# Patient Record
Sex: Female | Born: 1935 | Race: White | Hispanic: No | Smoking: Never smoker
Health system: Southern US, Community
[De-identification: ages and names within clinical notes are randomized; demographics above are authoritative.]

## PROBLEM LIST (undated history)

## (undated) DIAGNOSIS — I8500 Esophageal varices without bleeding: Secondary | ICD-10-CM

## (undated) DIAGNOSIS — I10 Essential (primary) hypertension: Secondary | ICD-10-CM

## (undated) DIAGNOSIS — H269 Unspecified cataract: Secondary | ICD-10-CM

## (undated) DIAGNOSIS — I4891 Unspecified atrial fibrillation: Secondary | ICD-10-CM

## (undated) DIAGNOSIS — R32 Unspecified urinary incontinence: Secondary | ICD-10-CM

## (undated) DIAGNOSIS — F0390 Unspecified dementia without behavioral disturbance: Secondary | ICD-10-CM

## (undated) DIAGNOSIS — F329 Major depressive disorder, single episode, unspecified: Secondary | ICD-10-CM

## (undated) DIAGNOSIS — K8020 Calculus of gallbladder without cholecystitis without obstruction: Secondary | ICD-10-CM

## (undated) DIAGNOSIS — K649 Unspecified hemorrhoids: Secondary | ICD-10-CM

## (undated) DIAGNOSIS — E785 Hyperlipidemia, unspecified: Secondary | ICD-10-CM

## (undated) DIAGNOSIS — I639 Cerebral infarction, unspecified: Secondary | ICD-10-CM

## (undated) DIAGNOSIS — N390 Urinary tract infection, site not specified: Secondary | ICD-10-CM

## (undated) DIAGNOSIS — M810 Age-related osteoporosis without current pathological fracture: Secondary | ICD-10-CM

## (undated) DIAGNOSIS — K219 Gastro-esophageal reflux disease without esophagitis: Secondary | ICD-10-CM

## (undated) DIAGNOSIS — K5900 Constipation, unspecified: Secondary | ICD-10-CM

## (undated) DIAGNOSIS — N189 Chronic kidney disease, unspecified: Secondary | ICD-10-CM

## (undated) DIAGNOSIS — K115 Sialolithiasis: Secondary | ICD-10-CM

## (undated) DIAGNOSIS — D649 Anemia, unspecified: Secondary | ICD-10-CM

## (undated) DIAGNOSIS — F32A Depression, unspecified: Secondary | ICD-10-CM

## (undated) DIAGNOSIS — M1990 Unspecified osteoarthritis, unspecified site: Secondary | ICD-10-CM

## (undated) DIAGNOSIS — S72009A Fracture of unspecified part of neck of unspecified femur, initial encounter for closed fracture: Secondary | ICD-10-CM

## (undated) HISTORY — PX: HIP FRACTURE SURGERY: SHX118

---

## 2008-10-14 ENCOUNTER — Ambulatory Visit (HOSPITAL_COMMUNITY): Admission: RE | Admit: 2008-10-14 | Discharge: 2008-10-14 | Payer: Self-pay | Admitting: Orthopedic Surgery

## 2008-10-18 ENCOUNTER — Inpatient Hospital Stay (HOSPITAL_COMMUNITY): Admission: EM | Admit: 2008-10-18 | Discharge: 2008-10-26 | Payer: Self-pay | Admitting: Emergency Medicine

## 2010-11-16 LAB — PROTIME-INR
INR: 3.4 — ABNORMAL HIGH (ref 0.00–1.49)
Prothrombin Time: 32.8 seconds — ABNORMAL HIGH (ref 11.6–15.2)
Prothrombin Time: 40.5 seconds — ABNORMAL HIGH (ref 11.6–15.2)
Prothrombin Time: 34.9 seconds — ABNORMAL HIGH (ref 11.6–15.2)

## 2010-11-16 LAB — POCT I-STAT, CHEM 8
Calcium, Ion: 1.04 mmol/L — ABNORMAL LOW (ref 1.12–1.32)
Creatinine, Ser: 0.9 mg/dL (ref 0.4–1.2)
Glucose, Bld: 136 mg/dL — ABNORMAL HIGH (ref 70–99)
Hemoglobin: 18.7 g/dL — ABNORMAL HIGH (ref 12.0–15.0)
TCO2: 25 mmol/L (ref 0–100)

## 2010-11-16 LAB — DIFFERENTIAL
Basophils Relative: 1 % (ref 0–1)
Basophils Relative: 1 % (ref 0–1)
Eosinophils Absolute: 0 10*3/uL (ref 0.0–0.7)
Eosinophils Absolute: 0 10*3/uL (ref 0.0–0.7)
Eosinophils Relative: 0 % (ref 0–5)
Eosinophils Relative: 5 % (ref 0–5)
Lymphocytes Relative: 27 % (ref 12–46)
Lymphs Abs: 1.4 10*3/uL (ref 0.7–4.0)
Lymphs Abs: 1.5 10*3/uL (ref 0.7–4.0)
Monocytes Absolute: 0.3 10*3/uL (ref 0.1–1.0)
Monocytes Absolute: 0.6 10*3/uL (ref 0.1–1.0)
Monocytes Relative: 10 % (ref 3–12)
Monocytes Relative: 8 % (ref 3–12)
Neutrophils Relative %: 70 % (ref 43–77)

## 2010-11-16 LAB — CBC
HCT: 44 % (ref 36.0–46.0)
Hemoglobin: 15.5 g/dL — ABNORMAL HIGH (ref 12.0–15.0)
Hemoglobin: 17 g/dL — ABNORMAL HIGH (ref 12.0–15.0)
MCHC: 33.8 g/dL (ref 30.0–36.0)
MCHC: 34.4 g/dL (ref 30.0–36.0)
MCV: 92.8 fL (ref 78.0–100.0)
MCV: 92.3 fL (ref 78.0–100.0)
Platelets: 305 10*3/uL (ref 150–400)
Platelets: 301 10*3/uL (ref 150–400)
Platelets: 334 10*3/uL (ref 150–400)
RBC: 4.94 MIL/uL (ref 3.87–5.11)
RDW: 15 % (ref 11.5–15.5)
RDW: 14.7 % (ref 11.5–15.5)
RDW: 14.8 % (ref 11.5–15.5)
WBC: 6.9 10*3/uL (ref 4.0–10.5)

## 2010-11-16 LAB — COMPREHENSIVE METABOLIC PANEL
ALT: 14 U/L (ref 0–35)
ALT: 14 U/L (ref 0–35)
AST: 30 U/L (ref 0–37)
AST: 32 U/L (ref 0–37)
Albumin: 2.8 g/dL — ABNORMAL LOW (ref 3.5–5.2)
Albumin: 3.1 g/dL — ABNORMAL LOW (ref 3.5–5.2)
Alkaline Phosphatase: 60 U/L (ref 39–117)
Alkaline Phosphatase: 75 U/L (ref 39–117)
CO2: 24 mEq/L (ref 19–32)
Calcium: 8.7 mg/dL (ref 8.4–10.5)
Calcium: 8.9 mg/dL (ref 8.4–10.5)
Chloride: 102 mEq/L (ref 96–112)
Creatinine, Ser: 1 mg/dL (ref 0.4–1.2)
GFR calc Af Amer: >60 mL/min (ref >60)
GFR calc Af Amer: >60 mL/min (ref >60)
GFR calc non Af Amer: >60 mL/min (ref >60)
GFR calc non Af Amer: 52 mL/min — ABNORMAL LOW (ref >60)
Glucose, Bld: 146 mg/dL — ABNORMAL HIGH (ref 70–99)
Potassium: 3.5 mEq/L (ref 3.5–5.1)
Potassium: 3.6 mEq/L (ref 3.5–5.1)
Sodium: 136 mEq/L (ref 135–145)
Sodium: 135 mEq/L (ref 135–145)
Sodium: 136 mEq/L (ref 135–145)
Total Protein: 6.5 g/dL (ref 6.0–8.3)
Total Protein: 7.2 g/dL (ref 6.0–8.3)

## 2010-11-16 LAB — TYPE AND SCREEN
ABO/RH(D): A POS
Antibody Screen: NEGATIVE

## 2010-11-16 LAB — BASIC METABOLIC PANEL
BUN: 5 mg/dL — ABNORMAL LOW (ref 6–23)
Chloride: 102 mEq/L (ref 96–112)
Creatinine, Ser: 0.76 mg/dL (ref 0.4–1.2)
Glucose, Bld: 144 mg/dL — ABNORMAL HIGH (ref 70–99)
Potassium: 3.7 mEq/L (ref 3.5–5.1)

## 2010-11-16 LAB — URINALYSIS, ROUTINE W REFLEX MICROSCOPIC
Glucose, UA: NEGATIVE mg/dL
Ketones, ur: >80 mg/dL — AB
Leukocytes, UA: NEGATIVE
Protein, ur: 30 mg/dL — AB

## 2010-11-16 LAB — URINE CULTURE: Culture: NO GROWTH

## 2010-11-16 LAB — TROPONIN I: Troponin I: 0.02 ng/mL (ref 0.00–0.06)

## 2010-11-16 LAB — GLUCOSE, CAPILLARY: Glucose-Capillary: 139 mg/dL — ABNORMAL HIGH (ref 70–99)

## 2010-11-16 LAB — CK TOTAL AND CKMB (NOT AT ARMC)
CK, MB: 2.4 ng/mL (ref 0.3–4.0)
Total CK: 53 U/L (ref 7–177)

## 2010-12-19 NOTE — Discharge Summary (Signed)
[name], [name]                  ACCOUNT NO.:  [id number]   MEDICAL RECORD NO.:  [id number]          PATIENT TYPE:  INP   LOCATION:  4530                         FACILITY:  [facility]   PHYSICIAN:  Peggye Pitt, M.D. DATE OF BIRTH:  May 28, 1917   DATE OF ADMISSION:  10/18/2008  DATE OF DISCHARGE:  10/26/2008                               DISCHARGE SUMMARY   DISCHARGE DIAGNOSES:  1. Abdominal pain, resolved.  2. Nausea and vomiting, resolved.  3. Atrial fibrillation off Coumadin.  4. Dementia.  5. Gastroesophageal reflux disease.  6. History of esophageal varices.  7. Possible thoracic aortic dissection.  8. Hypertension.  9. Failure to thrive.  10.Dehydration, resolved.  11.History of cerebrovascular accident.  12.History of hip fracture, no surgical intervention entertained.   DISCHARGE MEDICATIONS:  1. Lopressor 100 mg twice daily.  2. Mirtazapine 15 mg daily.  3. Prevacid 30 mg daily.  4. Calcium plus Vitamin D 600 mg 1 tablet twice daily.  5. Senna 1 tablet as needed for constipation.  6. Multivitamin 1 tablet daily.  7. Tylenol 650 mg p.o. every 6 h., as needed for pain.  8. Roxanol 5 mg every 4 h., as needed for pain.   DISPOSITION AND FOLLOW-UP:  [name] is discharged to a nursing  facility today in stable condition.  Please note that she is now a full  no code blue and a palliative care patient.  A palliative care  consultation needs to be obtained upon arrival at the nursing facility.  Please also note that she has a possible thoracic aortic dissection per  CT scan, so it is imperative that her blood pressure remain adequately  controlled.  On the day of this dictation, I have increased her  metoprolol 50 to 100 mg twice daily.  Her blood pressure will need to be  closely monitored and further antihypertensives added as needed.   CONSULTATIONS THIS HOSPITALIZATION:  Dr. Karilyn Cota with palliative care.   IMAGES AND PROCEDURES PERFORMED:  1. CT scan of the head  without contrast on October 18, 2008, that showed      atrophy and microvascular white matter disease with no acute      intracranial abnormality.  2. CT scan of the abdomen and pelvis on October 18, 2008, that showed      atherosclerotic changes involving the aorta without focal aneurysm.      Probable large ulcerative plaque versus small focal dissection      involving the distal descending thoracic aorta.  A hiatal hernia.      Cholelithiasis and mild gallbladder distention without definite CT      findings for acute cholecystitis.  No acute abdominal findings,      mass, lesions or adenopathy.  A chronic non-union hip fracture.   HISTORY AND PHYSICAL EXAM:  For full details, please refer to dictation  by Dr. Abram Sander on date October 18, 2008.  But in brief, [name] is a  pleasant 77 year old Caucasian woman who was scheduled to have repair of  her left hip fracture on Wednesday, October 20, 2008.  However, for  the  past several weeks she has been having chronic abdominal pain and has  been nauseated.  On the night prior to admission, she started vomiting  and retching to the point where the patient's family decided to bring  her into the hospital for evaluation and management.   HOSPITAL COURSE BY ACTIVE PROBLEM:  1. Abdominal pain, nausea and vomiting, completely resolved while in      the hospital.  This was likely related to possibly some acute viral      gastroenteritis, again this has completely resolved.  2. For her possible thoracic aortic dissection.  At this point, it is      imperative that she maintain good blood pressure control.      Metoprolol has been increased to 100 mg twice daily, and this will      need to be closely followed and medications adjusted as needed.  3. For her atrial fibrillation, it has been decided that she will be      maintained off Coumadin.  At present, good heart rate control on      beta-blocker.  4. Goals of care.  [name] has been made a full  DNR/DNI, no code      blue with full comfort care.  She will be transferred to a nursing      facility today where a palliative care consult is to be obtained.      No aggressive measures are to be entertained at this point.  5. The rest of the chronic medical issues were not a problem this      hospitalization.   Vital signs upon the day of this discharge; blood pressure 152/68, heart  rate 81, respirations 18.  O2 saturations 96% on room air with a  temperature of 99.1.      Peggye Pitt, M.D.  Electronically Signed     EH/MEDQ  D:  10/26/2008  T:  10/26/2008  Job:  [id number]   cc:   Wilson Singer, M.D.

## 2010-12-19 NOTE — H&P (Signed)
NAMECLETIS, MUMA                  ACCOUNT NO.:  [id number]   MEDICAL RECORD NO.:  [id number]          PATIENT TYPE:  INP   LOCATION:  5530                         FACILITY:  [facility]   PHYSICIAN:  Ruthy Dick, MD    DATE OF BIRTH:  [birth year]   DATE OF ADMISSION:  10/18/2008  DATE OF DISCHARGE:                              HISTORY & PHYSICAL   The patient seen and examined in the emergency room.   CHIEF COMPLAINTS:  Nausea, vomiting, abdominal pain, and failure to  thrive.   HISTORY OF PRESENT ILLNESS:  Ms. Chisolm is a cachectic 77 year old lady  with a past medical history significant for atrial fibrillation,  dementia, esophageal varices, gastroesophageal reflux disease, CVA, and  recurrent urinary tract infection.  She was actually scheduled to have  her left hip surgery on Wednesday, 03/17, but for the past several  weeks, the patient has been having issues with abdomen and has been  nauseated.  Last night, she started vomiting and as of this morning she  was retching to the point that the family decided to bring the patient  to the hospital.  I spoke to the daughter, Johnny Bridge, over the phone and  she said that the patient has stopped eating since about 2 weeks ago and  last week she had told the daughter that she wanted to die.  In any  case, the daughter said that the patient also had some element of  altered mentation today and that was also the reason they decided to  seek medical attention.  When I saw the patient, the grandson was in the  room with her and all the patient could really tell me was that she was  hurting in the abdominal area.  Her mentation was a little bit off, even  though she knew she was in the hospital.  I asked whether she was  hungry, she said she was not hungry, but she has not eaten for a while  now according to family members.  According to them, she has also lost a  lot of weight during this time.  Because the patient is not able to give  a good  history, our history taking is limited in this regards.  Of note  is the fact that the patient's vomitus was yellowish and no bloody  contents at all.  She did not have any diarrhea and according to the  daughter, the stools were formed and had no bloody contents either.   PAST MEDICAL HISTORY:  Atrial fibrillation, dementia, esophageal  varices, GERD, CVA in the past, and recurrent urinary tract infection  for which she is getting treatment, about 3 days remaining.  She has  also had hip fractures and left shoulder fracture for which she needed  surgery.  According to family members, they had to stop her Coumadin  about 3 days ago, so that she can go in for surgery, but this probably  would now hold because the patient could not get the stress test that  was being scheduled for today.   SOCIAL HISTORY:  The patient  lives at home now after being in the  nursing home for a while, but the daughter is considering asking social  service to help with in the nursing home again.  No alcohol, drugs, or  tobacco use.   FAMILY HISTORY:  Noncontributory.  The patient unable to answer this  question.   MEDICATIONS:  Presently, the patient takes,  1. Calcium 2 pills once daily.  2. Jantoven 2.5 mg.  3. Laxative senna as needed.  4. Mirtazapine 15 mg at bedtime.  5. Multivitamins 2 pills daily.  6. Prevacid 30 mg p.o. daily.  7. Tramadol.  8. Acetaminophen 50 mg as needed.  9. Tylenol as needed.   ALLERGIES:  CODEINE.   REVIEW OF SYSTEMS:  Unable to review systems because of the patient's  mentation.   PHYSICAL EXAMINATION:  GENERAL:  Seen and examined in the emergency  room.  She is partly alert, but slightly confused at this time, able to  answer some questions, but not all those.  VITAL SIGNS:  Temperature is 99.3, pulse is 112, blood pressure is  162/89, respirations 18, and she is saturating 97% on room air.  HEENT:  Normocephalic and atraumatic.  Pupils equal, round, and reactive   to light.  Extraocular muscles intact.  Nares patent.  NECK:  Supple.  No JVD.  No lymphadenopathy.  No thyromegaly.  CHEST:  Clear to auscultation bilaterally.  ABDOMEN:  Soft, but tenderness mostly in the epigastric and right upper  quadrant region.  No rebound.  No guarding.  EXTREMITIES:  No clubbing.  No cyanosis.  No edema.  CARDIOVASCULAR:  First and second heart sounds, only the tachycardia,  but regular rhythm.  CENTRAL NERVOUS SYSTEM:  No obvious focal deficits at this time, but as  already mentioned, confusion is slightly present.   LABORATORY DATA AND INVESTIGATIONS:  CT scan of the head read as being  negative for intracranial abnormalities.  Hemoglobin 18, hematocrit 55,  and WBC 4.2.  INR is 3.4 and PT 36.  Sodium 135, potassium 3.7, glucose  136, BUN 18, creatinine 0.9, and albumin 3.1.  Urinalysis shows no  evidence of urinary tract infection at this time.  Troponin is less than  0.05.  Lipase is 24.   ASSESSMENT:  1. Nausea and vomiting.  2. Abdominal pain.  3. Atrial fibrillation.  4. Dementia.  5. Esophageal varices.  6. Gastroesophageal reflux disease.  7. History of cerebrovascular accident.  8. Recurrent urinary tract infection, poorly treated.  9. History of hip fracture, was being planned for surgery.  10.Hypertension.  11.Failure to thrive.  12.Coagulopathy.  13.Dehydration.   PLAN OF CARE:  We will admit this patient to medicine floor and treat  her with IV fluids and also workup abdominal pain, nausea, and vomiting.  At this time, this may just be gastroenteritis, but we have to rule out  other  possibilities.  Since the patient has had stool, this may not be an  obstruction, but again we will be awaiting the CAT scan of the abdomen.  There is no evidence of pancreatitis either.  The patient has a failure  to thrive and we will address the code status with the family and the  patient when they come in.      Ruthy Dick, MD  Electronically  Signed     GU/MEDQ  D:  10/18/2008  T:  10/19/2008  Job:  [id number]

## 2010-12-19 NOTE — Consult Note (Signed)
[name], [name]                  ACCOUNT NO.:  [id number]   MEDICAL RECORD NO.:  [id number]          PATIENT TYPE:  INP   LOCATION:  4530                         FACILITY:  [facility]   PHYSICIAN:  Wilson Singer, M.D.DATE OF BIRTH:  June 05, 1917   DATE OF CONSULTATION:  10/19/2008  DATE OF DISCHARGE:                                 CONSULTATION   Consult is for goals of care.   REQUESTING PHYSICIAN:  Ruthy Dick, MD   This nurse practitioner, Gardiner Rhyme, reviewed medical records, received  report from team, assessed the patient, and then met at the patient's  bedside with her daughter, [name], phone number [phone] to discuss  diagnosis, prognosis, goals of care, disposition, and options.   THE PATIENT'S/FAMILY GOALS:  1. DNR/DNI.  2. No artificial feeding now or in the future.  3. Transfer to the palliative care unit, comfort being the main focus      of care.      a.     Comfort feeds and fluids of choice with known risk of       aspiration.      b.     Symptom management.      c.     Continue IV fluids.      d.     No surgical intervention for hip repair.   A detailed discussion was held today regarding advance directives,  concepts specific to code status, artificial feeding, and hydration,  continued use of IV antibiotics, and rehospitalization.  The difference  between aggressive medical intervention path and a palliative comfort  care path was detailed.  The patient's daughter was grateful for the  opportunity to discuss and process this difficult decision making  situation.  The patient verbalized agreement with above goals.  Palliative care will continue to support holistically.   CHIEF COMPLAINT:  Weakness.   HISTORY OF PRESENT ILLNESS:  This patient is a frail cachectic 77-year-  old white female with a past medical history significant for a AFib,  dementia, esophageal varices, GERD, CVA, recurrent urinary tract  infection.  She was admitted on October 18, 2008, after several days of  nausea and vomiting and several weeks of significant decline to include  weakness, fatigue, decrease in mental alertness, increase in sleep  patterns, and weight loss.  She has a nonunion hip fracture that was  actually scheduled to be repaired on Wednesday, October 20, 2008, but due  to the patient's decline over the last many weeks, that discussion has  been put off for a later time.  The Palliative Care consult was held  today in light of the patient's advanced age and multiple comorbidities.  Palliative Care will continue to support holistically.   PAST MEDICAL HISTORY:  1. Atrial fib.  2. Dementia.  3. Esophageal varices.  4. GERD.  5. CVA.  6. Recurrent UTIs.  7. Decubitus ulcers.  8. Dysphagia.  9. Left hip fracture.  10.Left shoulder fracture.   FAMILY HISTORY:  Reviewed, noncontributory at this time.   SOCIAL HISTORY:  The patient lives at home with  her daughter, [name] at this point in time.  She had spent time in several skilled  nursing facilities for rehab over the past year post hip fracture in  June 2009.  The patient has 2 daughters and 2 sons, 1 son is  incarcerated in a facility in [place]. Daughter that is main caregiver  is [name], who is present at today's meeting.   Alcohol, tobacco, and illicit drug use is denied.   ALLERGIES:  CODEINE.   MEDICATIONS:  Reviewed, please see MAR for active list of meds.   REVIEW OF SYSTEMS:  As per mentioned in HPI and significant weight loss  of 60 pounds over the last year, decreased intake, increased difficulty  swallowing, decreased physical function, increased sleeping pattern;  otherwise, systems negative at this time.   PHYSICAL EXAMINATION:  VITAL SIGNS:  Blood pressure 186/88, temperature  99, pulse 89, respirations 20.  GENERAL:  This is a chronically ill-appearing frail elderly white  female.  HEENT:  Head is atraumatic.  Positive temporal muscle wasting.  Eyes,   pupils are equal and reactive to light.  Mouth, mucous membranes are  dry.  No exudate noted.  NECK:  Supple.  No JVD.  No lymphadenopathy.  CHEST:  Decreased in the bases but clear to auscultation.  ABDOMEN:  Soft, nontender, and positive bowel sounds.  HEART:  Regular rate and rhythm.  No murmur noted.  EXTREMITIES:  Without edema or erythema.  SKIN:  Warm and dry.  No rash or petechiae.  NEUROLOGIC:  The patient is alert and oriented x3.   LABORATORY DATA:  Reviewed from October 19, 2008, sodium 136, potassium  3.5, chloride 102, CO2 24, glucose 146, BUN 8, creatinine 0.72.  Total  protein 5.9, albumin blood 2.7, calcium 8.7.  WBCs 6.9, RBCs 4.94,  hemoglobin 15.5, hematocrit 45.8, platelet count 338.   Total time spent on the unit was 90 minutes, time in 2:30 and time out 4  o'clock.  Counseling and coordination of care composed 50% or greater  portion of this interaction.  Diagnosis and prognosis was clarified.  The concept of hospice and palliative care was reviewed.  Team approach  to our service was elicited.  Social worker with palliative  care team notified with concerns related to incarcerated son and  visitation.  A hard choices booklet was left with the family to review.  They are encouraged to call with questions or concerns.  Palliative Care  will continue to support holistically.      Herbert Pun, NP      Wilson Singer, M.D.  Electronically Signed    MCL/MEDQ  D:  10/19/2008  T:  10/20/2008  Job:  [id number]   cc:   Ruthy Dick, MD  [facility]

## 2014-02-18 ENCOUNTER — Emergency Department (HOSPITAL_COMMUNITY): Payer: PRIVATE HEALTH INSURANCE

## 2014-02-18 ENCOUNTER — Encounter (HOSPITAL_COMMUNITY): Payer: Self-pay | Admitting: Emergency Medicine

## 2014-02-18 ENCOUNTER — Inpatient Hospital Stay (HOSPITAL_COMMUNITY)
Admission: EM | Admit: 2014-02-18 | Discharge: 2014-02-20 | DRG: 534 | Disposition: A | Discharge status: Skilled Nursing Facility | Payer: PRIVATE HEALTH INSURANCE | Attending: Internal Medicine | Admitting: Internal Medicine

## 2014-02-18 ENCOUNTER — Inpatient Hospital Stay (HOSPITAL_COMMUNITY): Payer: PRIVATE HEALTH INSURANCE

## 2014-02-18 DIAGNOSIS — K219 Gastro-esophageal reflux disease without esophagitis: Secondary | ICD-10-CM | POA: Diagnosis present

## 2014-02-18 DIAGNOSIS — IMO0001 Reserved for inherently not codable concepts without codable children: Secondary | ICD-10-CM | POA: Diagnosis present

## 2014-02-18 DIAGNOSIS — M810 Age-related osteoporosis without current pathological fracture: Secondary | ICD-10-CM | POA: Diagnosis present

## 2014-02-18 DIAGNOSIS — S7291XA Unspecified fracture of right femur, initial encounter for closed fracture: Secondary | ICD-10-CM

## 2014-02-18 DIAGNOSIS — D72829 Elevated white blood cell count, unspecified: Secondary | ICD-10-CM | POA: Diagnosis present

## 2014-02-18 DIAGNOSIS — N189 Chronic kidney disease, unspecified: Secondary | ICD-10-CM | POA: Diagnosis present

## 2014-02-18 DIAGNOSIS — F329 Major depressive disorder, single episode, unspecified: Secondary | ICD-10-CM | POA: Diagnosis present

## 2014-02-18 DIAGNOSIS — Z79899 Other long term (current) drug therapy: Secondary | ICD-10-CM | POA: Diagnosis not present

## 2014-02-18 DIAGNOSIS — Z7401 Bed confinement status: Secondary | ICD-10-CM | POA: Diagnosis not present

## 2014-02-18 DIAGNOSIS — Z8673 Personal history of transient ischemic attack (TIA), and cerebral infarction without residual deficits: Secondary | ICD-10-CM

## 2014-02-18 DIAGNOSIS — F3289 Other specified depressive episodes: Secondary | ICD-10-CM | POA: Diagnosis present

## 2014-02-18 DIAGNOSIS — I4891 Unspecified atrial fibrillation: Secondary | ICD-10-CM | POA: Diagnosis present

## 2014-02-18 DIAGNOSIS — Z96649 Presence of unspecified artificial hip joint: Secondary | ICD-10-CM | POA: Diagnosis not present

## 2014-02-18 DIAGNOSIS — F411 Generalized anxiety disorder: Secondary | ICD-10-CM | POA: Diagnosis present

## 2014-02-18 DIAGNOSIS — Z7982 Long term (current) use of aspirin: Secondary | ICD-10-CM

## 2014-02-18 DIAGNOSIS — M1990 Unspecified osteoarthritis, unspecified site: Secondary | ICD-10-CM | POA: Diagnosis present

## 2014-02-18 DIAGNOSIS — S72001A Fracture of unspecified part of neck of right femur, initial encounter for closed fracture: Secondary | ICD-10-CM

## 2014-02-18 DIAGNOSIS — T753XXA Motion sickness, initial encounter: Secondary | ICD-10-CM | POA: Diagnosis present

## 2014-02-18 DIAGNOSIS — E785 Hyperlipidemia, unspecified: Secondary | ICD-10-CM | POA: Diagnosis present

## 2014-02-18 DIAGNOSIS — S72009P Fracture of unspecified part of neck of unspecified femur, subsequent encounter for closed fracture with malunion: Secondary | ICD-10-CM

## 2014-02-18 DIAGNOSIS — S72309A Unspecified fracture of shaft of unspecified femur, initial encounter for closed fracture: Secondary | ICD-10-CM | POA: Diagnosis present

## 2014-02-18 DIAGNOSIS — I129 Hypertensive chronic kidney disease with stage 1 through stage 4 chronic kidney disease, or unspecified chronic kidney disease: Secondary | ICD-10-CM | POA: Diagnosis present

## 2014-02-18 DIAGNOSIS — Z66 Do not resuscitate: Secondary | ICD-10-CM | POA: Diagnosis present

## 2014-02-18 DIAGNOSIS — M25559 Pain in unspecified hip: Secondary | ICD-10-CM | POA: Diagnosis not present

## 2014-02-18 DIAGNOSIS — R030 Elevated blood-pressure reading, without diagnosis of hypertension: Secondary | ICD-10-CM

## 2014-02-18 DIAGNOSIS — F03918 Unspecified dementia, unspecified severity, with other behavioral disturbance: Secondary | ICD-10-CM | POA: Diagnosis present

## 2014-02-18 DIAGNOSIS — I480 Paroxysmal atrial fibrillation: Secondary | ICD-10-CM

## 2014-02-18 DIAGNOSIS — F0391 Unspecified dementia with behavioral disturbance: Secondary | ICD-10-CM | POA: Diagnosis present

## 2014-02-18 DIAGNOSIS — F32A Depression, unspecified: Secondary | ICD-10-CM

## 2014-02-18 DIAGNOSIS — S72009A Fracture of unspecified part of neck of unspecified femur, initial encounter for closed fracture: Secondary | ICD-10-CM

## 2014-02-18 DIAGNOSIS — D649 Anemia, unspecified: Secondary | ICD-10-CM | POA: Diagnosis present

## 2014-02-18 HISTORY — DX: Unspecified atrial fibrillation: I48.91

## 2014-02-18 HISTORY — DX: Essential (primary) hypertension: I10

## 2014-02-18 HISTORY — DX: Depression, unspecified: F32.A

## 2014-02-18 HISTORY — DX: Cerebral infarction, unspecified: I63.9

## 2014-02-18 HISTORY — DX: Hyperlipidemia, unspecified: E78.5

## 2014-02-18 HISTORY — DX: Urinary tract infection, site not specified: N39.0

## 2014-02-18 HISTORY — DX: Esophageal varices without bleeding: I85.00

## 2014-02-18 HISTORY — DX: Unspecified osteoarthritis, unspecified site: M19.90

## 2014-02-18 HISTORY — DX: Unspecified cataract: H26.9

## 2014-02-18 HISTORY — DX: Major depressive disorder, single episode, unspecified: F32.9

## 2014-02-18 HISTORY — DX: Calculus of gallbladder without cholecystitis without obstruction: K80.20

## 2014-02-18 HISTORY — DX: Constipation, unspecified: K59.00

## 2014-02-18 HISTORY — DX: Unspecified dementia, unspecified severity, without behavioral disturbance, psychotic disturbance, mood disturbance, and anxiety: F03.90

## 2014-02-18 HISTORY — DX: Sialolithiasis: K11.5

## 2014-02-18 HISTORY — DX: Age-related osteoporosis without current pathological fracture: M81.0

## 2014-02-18 HISTORY — DX: Chronic kidney disease, unspecified: N18.9

## 2014-02-18 HISTORY — DX: Gastro-esophageal reflux disease without esophagitis: K21.9

## 2014-02-18 HISTORY — DX: Anemia, unspecified: D64.9

## 2014-02-18 HISTORY — DX: Unspecified hemorrhoids: K64.9

## 2014-02-18 HISTORY — DX: Unspecified urinary incontinence: R32

## 2014-02-18 HISTORY — DX: Fracture of unspecified part of neck of unspecified femur, initial encounter for closed fracture: S72.009A

## 2014-02-18 LAB — ABO/RH: ABO/RH(D): A POS

## 2014-02-18 LAB — BASIC METABOLIC PANEL
Anion gap: 14 (ref 5–15)
BUN: 21 mg/dL (ref 6–23)
CO2: 21 mEq/L (ref 19–32)
CREATININE: 0.87 mg/dL (ref 0.50–1.10)
Calcium: 9.3 mg/dL (ref 8.4–10.5)
Chloride: 102 mEq/L (ref 96–112)
GFR, EST AFRICAN AMERICAN: 64 mL/min — AB (ref >90)
GFR, EST NON AFRICAN AMERICAN: 55 mL/min — AB (ref >90)
GLUCOSE: 173 mg/dL — AB (ref 70–99)
Potassium: 4.2 mEq/L (ref 3.7–5.3)
Sodium: 137 mEq/L (ref 137–147)

## 2014-02-18 LAB — CBC
HEMATOCRIT: 36.3 % (ref 36.0–46.0)
HEMOGLOBIN: 12.1 g/dL (ref 12.0–15.0)
MCH: 31.9 pg (ref 26.0–34.0)
MCHC: 33.3 g/dL (ref 30.0–36.0)
MCV: 95.8 fL (ref 78.0–100.0)
Platelets: 328 10*3/uL (ref 150–400)
RBC: 3.79 MIL/uL — ABNORMAL LOW (ref 3.87–5.11)
RDW: 13.4 % (ref 11.5–15.5)
WBC: 12.2 10*3/uL — ABNORMAL HIGH (ref 4.0–10.5)

## 2014-02-18 LAB — URINALYSIS, ROUTINE W REFLEX MICROSCOPIC
BILIRUBIN URINE: NEGATIVE
GLUCOSE, UA: NEGATIVE mg/dL
Hgb urine dipstick: NEGATIVE
KETONES UR: NEGATIVE mg/dL
LEUKOCYTES UA: NEGATIVE
NITRITE: NEGATIVE
PH: 6 (ref 5.0–8.0)
Protein, ur: 30 mg/dL — AB
SPECIFIC GRAVITY, URINE: 1.016 (ref 1.005–1.030)
Urobilinogen, UA: 0.2 mg/dL (ref 0.0–1.0)

## 2014-02-18 LAB — URINE MICROSCOPIC-ADD ON

## 2014-02-18 LAB — PROTIME-INR
INR: 0.94 (ref 0.00–1.49)
Prothrombin Time: 12.6 seconds (ref 11.6–15.2)

## 2014-02-18 LAB — TYPE AND SCREEN
ABO/RH(D): A POS
Antibody Screen: NEGATIVE

## 2014-02-18 MED ORDER — PHENOL 1.4 % MT LIQD: 1.0000 | OROMUCOSAL | Status: DC | PRN

## 2014-02-18 MED ORDER — ADULT MULTIVITAMIN LIQUID CH
5.0000 mL | Freq: Every day | ORAL | Status: DC
  Administered 2014-02-20: 5 mL via ORAL
  Filled 2014-02-18 (×3): qty 5

## 2014-02-18 MED ORDER — MULTIVITAMINS PO CHEW: 1.0000 | CHEWABLE_TABLET | Freq: Every day | ORAL | Status: DC

## 2014-02-18 MED ORDER — HYDROCODONE-ACETAMINOPHEN 5-325 MG PO TABS: 1.0000 | ORAL_TABLET | Freq: Four times a day (QID) | ORAL | Status: DC | PRN

## 2014-02-18 MED ORDER — PANTOPRAZOLE SODIUM 40 MG PO TBEC
40.0000 mg | DELAYED_RELEASE_TABLET | Freq: Every day | ORAL | Status: DC
  Administered 2014-02-19 – 2014-02-20 (×2): 40 mg via ORAL
  Filled 2014-02-18 (×2): qty 1

## 2014-02-18 MED ORDER — DEXTROSE 5 % IV SOLN
1.0000 g | Freq: Two times a day (BID) | INTRAVENOUS | Status: DC
  Administered 2014-02-19 (×2): 1 g via INTRAVENOUS
  Filled 2014-02-18 (×2): qty 1

## 2014-02-18 MED ORDER — LORAZEPAM 0.5 MG PO TABS: 0.2500 mg | ORAL_TABLET | Freq: Four times a day (QID) | ORAL | Status: DC | PRN

## 2014-02-18 MED ORDER — DOCUSATE SODIUM 100 MG PO CAPS: 100.0000 mg | ORAL_CAPSULE | Freq: Two times a day (BID) | ORAL | Status: DC

## 2014-02-18 MED ORDER — TRAMADOL-ACETAMINOPHEN 37.5-325 MG PO TABS
1.0000 | ORAL_TABLET | Freq: Two times a day (BID) | ORAL | Status: DC
  Administered 2014-02-19 – 2014-02-20 (×3): 1 via ORAL
  Filled 2014-02-18 (×4): qty 1

## 2014-02-18 MED ORDER — CALCIUM CARBONATE 600 MG PO TABS
1.0000 | ORAL_TABLET | Freq: Every day | ORAL | Status: DC
  Filled 2014-02-18: qty 1

## 2014-02-18 MED ORDER — VANCOMYCIN HCL IN DEXTROSE 750-5 MG/150ML-% IV SOLN
750.0000 mg | INTRAVENOUS | Status: DC
  Administered 2014-02-19: 750 mg via INTRAVENOUS
  Filled 2014-02-18: qty 150

## 2014-02-18 MED ORDER — POLYETHYLENE GLYCOL 3350 17 G PO PACK
17.0000 g | PACK | Freq: Every day | ORAL | Status: DC
  Administered 2014-02-19 – 2014-02-20 (×2): 17 g via ORAL

## 2014-02-18 MED ORDER — FENTANYL 12 MCG/HR TD PT72
12.0000 ug | MEDICATED_PATCH | TRANSDERMAL | Status: DC
  Administered 2014-02-19: 12.5 ug via TRANSDERMAL
  Filled 2014-02-18: qty 1

## 2014-02-18 MED ORDER — BISACODYL 10 MG RE SUPP: 10.0000 mg | Freq: Every day | RECTAL | Status: DC | PRN

## 2014-02-18 MED ORDER — FENTANYL CITRATE 0.05 MG/ML IJ SOLN
50.0000 ug | Freq: Once | INTRAMUSCULAR | Status: AC
  Administered 2014-02-18: 50 ug via INTRAVENOUS
  Filled 2014-02-18: qty 2

## 2014-02-18 MED ORDER — HYDRALAZINE HCL 20 MG/ML IJ SOLN
10.0000 mg | INTRAMUSCULAR | Status: DC | PRN
  Filled 2014-02-18: qty 0.5

## 2014-02-18 MED ORDER — ANTIPYRINE-BENZOCAINE 5.4-1.4 % OT SOLN: 4.0000 [drp] | Freq: Four times a day (QID) | OTIC | Status: DC | PRN

## 2014-02-18 MED ORDER — ONDANSETRON HCL 4 MG/2ML IJ SOLN
4.0000 mg | Freq: Once | INTRAMUSCULAR | Status: AC
  Administered 2014-02-18: 4 mg via INTRAVENOUS
  Filled 2014-02-18: qty 2

## 2014-02-18 MED ORDER — BIOTENE DRY MOUTH MT LIQD: 15.0000 mL | OROMUCOSAL | Status: DC | PRN

## 2014-02-18 MED ORDER — CALCIUM CARBONATE 1250 (500 CA) MG PO TABS
1.0000 | ORAL_TABLET | Freq: Every day | ORAL | Status: DC
  Administered 2014-02-19 – 2014-02-20 (×2): 500 mg via ORAL
  Filled 2014-02-18 (×3): qty 1

## 2014-02-18 MED ORDER — ESCITALOPRAM OXALATE 10 MG PO TABS
10.0000 mg | ORAL_TABLET | Freq: Every day | ORAL | Status: DC
  Administered 2014-02-19 – 2014-02-20 (×2): 10 mg via ORAL
  Filled 2014-02-18 (×3): qty 1

## 2014-02-18 MED ORDER — MORPHINE SULFATE 2 MG/ML IJ SOLN
0.5000 mg | INTRAMUSCULAR | Status: DC | PRN
  Administered 2014-02-19 (×2): 0.5 mg via INTRAVENOUS
  Filled 2014-02-18 (×2): qty 1

## 2014-02-18 NOTE — Consult Note (Signed)
ORTHOPAEDIC CONSULTATION  REQUESTING PHYSICIAN: Eduard Clos, MD  Chief Complaint: right femur fx  HPI: [name] is a 80 y.o. female who complains of right femur fx after being moved by her caregivers and felt a snap in the thigh.  Had immediate pain.  Patient has not walked since 2010 ever since her original hip surgery was done for fx.  Has not transferred out of bed since march of this year.  Patient is moderately demented.    Past Medical History  Diagnosis Date  . Atrial fibrillation   . Esophageal reflux   . Hypertension   . Constipation   . Cholelithiasis   . Hemorrhoids   . Esophageal varices   . Dementia   . Depression   . Anemia   . Osteoarthritis   . Hyperlipidemia   . Sialolithiasis   . GERD (gastroesophageal reflux disease)   . Urinary incontinence   . Recurrent UTI (urinary tract infection)   . Chronic renal insufficiency   . CVA (cerebral infarction)   . Cataract   . Osteoporosis   . Hip fracture    Past Surgical History  Procedure Laterality Date  . Hip fracture surgery Right    History   Social History  . Marital Status: Widowed    Spouse Name: N/A    Number of Children: N/A  . Years of Education: N/A   Social History Main Topics  . Smoking status: Never Smoker   . Smokeless tobacco: None  . Alcohol Use: No  . Drug Use: No  . Sexual Activity: None   Other Topics Concern  . None   Social History Narrative  . None   History reviewed. No pertinent family history. Allergies  Allergen Reactions  . Codeine     Per MAR  . Septra [Sulfamethoxazole-Trimethoprim]     Per MAR  . Wellbutrin [Bupropion]     Per MAR   Prior to Admission medications   Medication Sig Start Date End Date Taking? Authorizing Provider  acetaminophen (TYLENOL) 325 MG tablet Take 650 mg by mouth every 4 (four) hours as needed for mild pain or fever.   Yes Historical Provider, MD  antipyrine-benzocaine Lyla Son) otic solution Place 4 drops into the  right ear 4 (four) times daily as needed for ear pain.   Yes Historical Provider, MD  antiseptic oral rinse (BIOTENE) LIQD 15 mLs by Mouth Rinse route every 4 (four) hours as needed for dry mouth.   Yes Historical Provider, MD  aspirin 81 MG chewable tablet Chew 81 mg by mouth daily with breakfast.   Yes Historical Provider, MD  benzocaine-menthol (CHLORASEPTIC) 6-10 MG lozenge Take 1 lozenge by mouth every 4 (four) hours as needed for sore throat.   Yes Historical Provider, MD  bisacodyl (DULCOLAX) 10 MG suppository Place 10 mg rectally daily as needed (for constipation).   Yes Historical Provider, MD  Calcium Carbonate 500 MG CHEW Chew 1 tablet by mouth daily with breakfast.   Yes Historical Provider, MD  docusate sodium (COLACE) 100 MG capsule Take 100 mg by mouth 2 (two) times daily.   Yes Historical Provider, MD  escitalopram (LEXAPRO) 10 MG tablet Take 10 mg by mouth daily with breakfast.   Yes Historical Provider, MD  fentaNYL (DURAGESIC) 12 MCG/HR Place 12 mcg onto the skin every 3 (three) days.   Yes Historical Provider, MD  LORazepam (ATIVAN) 0.5 MG tablet Take 0.25 mg by mouth every 6 (six) hours as needed for anxiety.  Yes Historical Provider, MD  morphine (ROXANOL) 20 MG/ML concentrated solution Take by mouth once. Dose is 0.125   Yes Historical Provider, MD  Multiple Vitamins-Minerals (MULTIVITAMINS) CHEW Chew 1 tablet by mouth daily with breakfast.   Yes Historical Provider, MD  omeprazole (PRILOSEC) 40 MG capsule Take 40 mg by mouth daily with breakfast.   Yes Historical Provider, MD  OVER THE COUNTER MEDICATION Take 1 Bottle by mouth 2 (two) times daily. Magic Cup with lunch and supper   Yes Historical Provider, MD  phenol (CHLORASEPTIC) 1.4 % LIQD Use as directed 1 spray in the mouth or throat every 2 (two) hours as needed for throat irritation / pain.   Yes Historical Provider, MD  polyethylene glycol (MIRALAX / GLYCOLAX) packet Take 17 g by mouth daily with breakfast.   Yes  Historical Provider, MD  traMADol-acetaminophen (ULTRACET) 37.5-325 MG per tablet Take 1 tablet by mouth 2 (two) times daily. Takes at 0800 and 1800   Yes Historical Provider, MD  Vitamin D, Ergocalciferol, (DRISDOL) 50000 UNITS CAPS capsule Take 50,000 Units by mouth every 30 (thirty) days. Takes on the 15th of every month   Yes Historical Provider, MD   Dg Pelvis 1-2 Views  02/18/2014   CLINICAL DATA:  Trauma  EXAM: PELVIS - 1-2 VIEW  COMPARISON:  Prior CT from 10/18/2008  FINDINGS: Sequelae of prior ORIF seen at the right hip. There is an acute subcapital fracture through the right femoral neck with superior subluxation of the proximal femoral shaft. The fracture traverses the fixation hardware. The hardware itself remains intact. The right femoral head remains aligned within the acetabulum. The femoral head itself is partially collapsed.  An additional acute oblique fracture through the proximal right femoral shaft is present without significant displacement.  Severe osteopenia present. Degenerative changes present within the lower lumbar spine. No other fracture seen within the pelvis.  IMPRESSION: 1. Acute fracture through the right femoral neck with superior subluxation. This fracture extends through fixation hardware from prior ORIF at the right hip. The hardware itself but remains intact. 2. Additional acute nondisplaced oblique fracture through the proximal shaft of the right femur. 3. Severe osteopenia.   Electronically Signed   By: Rise Mu M.D.   On: 02/18/2014 17:25   Dg Femur Right  02/18/2014   CLINICAL DATA:  HIP INJURY LEG INJURY  EXAM: RIGHT FEMUR - 2 VIEW  COMPARISON:  Prior study from 03/27/2008  FINDINGS: Sequelae of prior ORIF seen at the right hip. There is an acute fracture through the right femoral neck with superior subluxation of the right femoral shaft. Additional acute oblique fracture through the proximal shaft of the right femur is present. The right femoral head  remains within the right acetabulum.  Diffuse osteopenia noted.  No soft tissue abnormality.  IMPRESSION: 1. Acute oblique fracture through the proximal right femoral shaft. 2. Additional acute fracture through the right femoral neck with superior subluxation. 3. Sequelae of prior ORIF at the right hip. The proximal fracture through the femoral neck traverses the fixation hardware. The hardware itself is intact. 4. Diffuse osteopenia.   Electronically Signed   By: Rise Mu M.D.   On: 02/18/2014 17:20   Dg Chest Port 1 View  02/18/2014   CLINICAL DATA:  Shortness of breath and weakness.  EXAM: PORTABLE CHEST - 1 VIEW  COMPARISON:  03/24/2008  FINDINGS: Lungs are somewhat hypoinflated without effusion. There is minimal hazy density in the left retrocardiac region. There is mild cardiomegaly. There  is calcified plaque over the thoracic aorta. Left shoulder arthroplasty unchanged. There are moderate degenerative changes of the spine.  IMPRESSION: Minimal hazy density over the left retrocardiac region as cannot exclude atelectasis/effusion versus infection. Consider PA and lateral chest radiograph for better evaluation of the left base.  Mild cardiomegaly.   Electronically Signed   By: Elberta Fortisaniel  Boyle M.D.   On: 02/18/2014 21:02   Dg Knee Complete 4 Views Right  02/18/2014   CLINICAL DATA:  Trauma  EXAM: RIGHT KNEE - COMPLETE 4+ VIEW  COMPARISON:  Prior radiograph from 03/27/2008.  FINDINGS: No acute fracture or dislocation. No joint effusion. The knee is in anatomic alignment.  Severe osteopenia is present.  IMPRESSION: 1. No acute fracture or dislocation. 2. Severe osteopenia.   Electronically Signed   By: Rise MuBenjamin  McClintock M.D.   On: 02/18/2014 17:27    Positive ROS: All other systems have been reviewed and were otherwise negative with the exception of those mentioned in the HPI and as above.  Physical Exam: General: NAD Cardiovascular: No pedal edema Respiratory: No cyanosis, no use of  accessory musculature GI: No organomegaly, abdomen is soft and non-tender Skin: No lesions in the area of chief complaint Neurologic: Sensation intact distally Psychiatric: Pleasantly demented.  Not oriented to time.  Lymphatic: No axillary or cervical lymphadenopathy  MUSCULOSKELETAL:  - hip and knee flexion contractures - painful movement of RLE - wiggles toes on command - foot wwp  Assessment: Right periprosthetic femur fx  Plan: - patient's pain was well controlled with fentanyl per RN - after discussion with daughter and patient's baseline nonambulatory status, would question the benefit of surgical treatment - however, patient and family have requested Dr. August Saucerean, will ultimately leave decision up to Dr. August Saucerean and patient and family - NPO after midnight for now  Thank you for the consult and the opportunity to see Ms. Izell CarolinaMacon  N. Glee ArvinMichael Torrey Ballinas, MD [facility] [phone] 9:44 PM

## 2014-02-18 NOTE — ED Notes (Addendum)
Per EMS, [facility], c/o R leg/hip injury.  EMS reports that facility was attempting an In and Out catheterization when injury occurred.  Hx of hip fracture.    Family reports previous R hip fracture that never healed correctly.  Sts hips are flexed at all times.

## 2014-02-18 NOTE — ED Notes (Signed)
Bed: [id number] Expected date:  Expected time:  Means of arrival:  Comments: EMS HIP INJURY

## 2014-02-18 NOTE — ED Notes (Signed)
Pt. Foley catheter insertion documented in error. No foley catheter inserted by this Clinical research associatewriter.

## 2014-02-18 NOTE — ED Notes (Signed)
Post Fentanyl, sats dropped to 85%.  [place] oxygen 2l.  97% return.  Pt remained alert and oriented.

## 2014-02-18 NOTE — ED Provider Notes (Signed)
CSN: [id number]     Arrival date & time 02/18/14  1601 History   First MD Initiated Contact with Patient 02/18/14 1606     Chief Complaint  Patient presents with  . Hip Injury  . Leg Injury     (Consider location/radiation/quality/duration/timing/severity/associated sxs/prior Treatment) HPI Comments: At nursing home was getting in/out cath for concern of UTI due to delirium. Thought to have sustained a hip injury or R femur injury during the cath. Had R femur plated due to fracture in 2009.  Patient is a 80 y.o. female presenting with hip pain. The history is provided by the patient and the EMS personnel.  Hip Pain This is a new problem. The current episode started 1 to 2 hours ago. The problem occurs constantly. The problem has not changed since onset.Pertinent negatives include no chest pain, no abdominal pain and no headaches. Nothing aggravates the symptoms. Nothing relieves the symptoms. She has tried nothing for the symptoms. The treatment provided no relief.    Past Medical History  Diagnosis Date  . Atrial fibrillation   . Esophageal reflux   . Hypertension   . Constipation   . Cholelithiasis   . Hemorrhoids   . Esophageal varices   . Dementia   . Depression   . Anemia   . Osteoarthritis    History reviewed. No pertinent past surgical history. No family history on file. History  Substance Use Topics  . Smoking status: Never Smoker   . Smokeless tobacco: Not on file  . Alcohol Use: No   OB History   Grav Para Term Preterm Abortions TAB SAB Ect Mult Living                 Review of Systems  Unable to perform ROS: Dementia  Cardiovascular: Negative for chest pain.  Gastrointestinal: Negative for abdominal pain.  Neurological: Negative for headaches.      Allergies  Codeine; Septra; and Wellbutrin  Home Medications   Prior to Admission medications   Medication Sig Start Date End Date Taking? Authorizing Provider  docusate sodium (COLACE) 100 MG  capsule Take 100 mg by mouth 2 (two) times daily.   Yes Historical Provider, MD   BP 160/106  Pulse 107  Temp(Src) 98.6 F (37 C) (Oral)  Resp 20  SpO2 95% Physical Exam  Nursing note and vitals reviewed. Constitutional: She is oriented to person, place, and time. She appears well-developed and well-nourished. No distress.  HENT:  Head: Normocephalic and atraumatic.  Eyes: EOM are normal. Pupils are equal, round, and reactive to light.  Neck: Normal range of motion. Neck supple.  Cardiovascular: Normal rate and regular rhythm.  Exam reveals no friction rub.   No murmur heard. Pulmonary/Chest: Effort normal and breath sounds normal. No respiratory distress. She has no wheezes. She has no rales.  Abdominal: Soft. She exhibits no distension. There is no tenderness. There is no rebound.  Musculoskeletal: She exhibits no edema.       Right hip: She exhibits decreased range of motion, tenderness and bony tenderness.       Right upper leg: She exhibits tenderness, bony tenderness and deformity. She exhibits no edema and no laceration.  Neurological: She is alert and oriented to person, place, and time.  Skin: She is not diaphoretic.    ED Course  Procedures (including critical care time) Labs Review Labs Reviewed  CBC  BASIC METABOLIC PANEL  PROTIME-INR  URINALYSIS, ROUTINE W REFLEX MICROSCOPIC    Imaging Review Dg Pelvis  1-2 Views  02/18/2014   CLINICAL DATA:  Trauma  EXAM: PELVIS - 1-2 VIEW  COMPARISON:  Prior CT from 10/18/2008  FINDINGS: Sequelae of prior ORIF seen at the right hip. There is an acute subcapital fracture through the right femoral neck with superior subluxation of the proximal femoral shaft. The fracture traverses the fixation hardware. The hardware itself remains intact. The right femoral head remains aligned within the acetabulum. The femoral head itself is partially collapsed.  An additional acute oblique fracture through the proximal right femoral shaft is  present without significant displacement.  Severe osteopenia present. Degenerative changes present within the lower lumbar spine. No other fracture seen within the pelvis.  IMPRESSION: 1. Acute fracture through the right femoral neck with superior subluxation. This fracture extends through fixation hardware from prior ORIF at the right hip. The hardware itself but remains intact. 2. Additional acute nondisplaced oblique fracture through the proximal shaft of the right femur. 3. Severe osteopenia.   Electronically Signed   By: Rise Mu M.D.   On: 02/18/2014 17:25   Dg Femur Right  02/18/2014   CLINICAL DATA:  HIP INJURY LEG INJURY  EXAM: RIGHT FEMUR - 2 VIEW  COMPARISON:  Prior study from 03/27/2008  FINDINGS: Sequelae of prior ORIF seen at the right hip. There is an acute fracture through the right femoral neck with superior subluxation of the right femoral shaft. Additional acute oblique fracture through the proximal shaft of the right femur is present. The right femoral head remains within the right acetabulum.  Diffuse osteopenia noted.  No soft tissue abnormality.  IMPRESSION: 1. Acute oblique fracture through the proximal right femoral shaft. 2. Additional acute fracture through the right femoral neck with superior subluxation. 3. Sequelae of prior ORIF at the right hip. The proximal fracture through the femoral neck traverses the fixation hardware. The hardware itself is intact. 4. Diffuse osteopenia.   Electronically Signed   By: Rise Mu M.D.   On: 02/18/2014 17:20   Dg Chest Port 1 View  02/18/2014   CLINICAL DATA:  Shortness of breath and weakness.  EXAM: PORTABLE CHEST - 1 VIEW  COMPARISON:  03/24/2008  FINDINGS: Lungs are somewhat hypoinflated without effusion. There is minimal hazy density in the left retrocardiac region. There is mild cardiomegaly. There is calcified plaque over the thoracic aorta. Left shoulder arthroplasty unchanged. There are moderate degenerative  changes of the spine.  IMPRESSION: Minimal hazy density over the left retrocardiac region as cannot exclude atelectasis/effusion versus infection. Consider PA and lateral chest radiograph for better evaluation of the left base.  Mild cardiomegaly.   Electronically Signed   By: Elberta Fortis M.D.   On: 02/18/2014 21:02   Dg Knee Complete 4 Views Right  02/18/2014   CLINICAL DATA:  Trauma  EXAM: RIGHT KNEE - COMPLETE 4+ VIEW  COMPARISON:  Prior radiograph from 03/27/2008.  FINDINGS: No acute fracture or dislocation. No joint effusion. The knee is in anatomic alignment.  Severe osteopenia is present.  IMPRESSION: 1. No acute fracture or dislocation. 2. Severe osteopenia.   Electronically Signed   By: Rise Mu M.D.   On: 02/18/2014 17:27     EKG Interpretation   Date/Time:  Thursday February 18 2014 16:32:47 EDT Ventricular Rate:  99 PR Interval:  181 QRS Duration: 126 QT Interval:  376 QTC Calculation: 482 R Axis:   -49 Text Interpretation:  Sinus tachycardia Nonspecific IVCD with LAD Inferior  infarct, old Similar to prior Confirmed by [facility]  MD, Carlena SaxBLAIR [phone]([id number]) on  02/18/2014 4:36:29 PM      MDM   Final diagnoses:  Paroxysmal atrial fibrillation  Hip fracture, right, closed, initial encounter    67F here with hip/femur pain. Thought to have injured either her hip or femur on the R while staff was obtaining a cath urine sample. Had been delirious so staff was worried about a UTI. Here pleasant, disoriented to year, knows her age. R leg rotated inwards with decreased ROM. Difficult to discern if femur fracture or if hip dislocated. Daughters at bedside state hip was rotated externally prior to coming to the ED.  Will obtain xrays. Xrays show femur fracture, femoral neck fracture. NVI distally, no dislocation. Dr. Roda ShuttersXu with Ortho will see patient. Admitted to medicine.   Dagmar HaitWilliam Quintez Maselli, MD 02/19/14 [phone]

## 2014-02-18 NOTE — H&P (Addendum)
[facility] History and Physical  Alyssa Hoovertta A Vancuren KVQ:[id number] DOB: September 16, 1916 DOA: 02/18/2014  Referring physician: ER physician. PCP: No primary provider on file.  Chief Complaint: Right femur and hip fracture.  HPI: [name] is a 80 y.o. female with history of dementia, atrial fibrillation, esophageal strictures, CVA was brought to the ER from nursing home after patient complained of right hip pain. As per the history provided by patient's daughter, patient was found to be delirious today morning and nursing home staff was planning to get a urine sample when patient started developing right hip pain in the attempt to get the urine sample. X-rays show right femoral shaft and the right hip fracture and on call orthopedic surgeon was consulted and patient has been admitted for further management. Patient has been largely bedbound since 2010 after she had sustained a right hip fracture. Patient presently is back to her baseline with regarding to her mental status as per patient's daughter. Patient has been having episodes of delirium off and on. Patient presently is alert awake oriented to name and is able to recognize her family members and following commands. After patient received pain relief medications patient has had some nausea. On exam patient abdomen appears benign. Patient's right leg is internally rotated.   Review of Systems: As presented in the history of presenting illness, rest negative.  Past Medical History  Diagnosis Date  . Atrial fibrillation   . Esophageal reflux   . Hypertension   . Constipation   . Cholelithiasis   . Hemorrhoids   . Esophageal varices   . Dementia   . Depression   . Anemia   . Osteoarthritis   . Hyperlipidemia   . Sialolithiasis   . GERD (gastroesophageal reflux disease)   . Urinary incontinence   . Recurrent UTI (urinary tract infection)   . Chronic renal insufficiency   . CVA (cerebral infarction)   . Cataract   . Osteoporosis   .  Hip fracture    Past Surgical History  Procedure Laterality Date  . Hip fracture surgery Right    Social History:  reports that she has never smoked. She does not have any smokeless tobacco history on file. She reports that she does not drink alcohol or use illicit drugs. Where does patient live nursing home. Can patient participate in ADLs? No.  Allergies  Allergen Reactions  . Codeine     Per MAR  . Septra [Sulfamethoxazole-Trimethoprim]     Per MAR  . Wellbutrin [Bupropion]     Per MAR    Family History: History reviewed. No pertinent family history.    Prior to Admission medications   Medication Sig Start Date End Date Taking? Authorizing Provider  acetaminophen (TYLENOL) 325 MG tablet Take 650 mg by mouth every 4 (four) hours as needed for mild pain or fever.   Yes Historical Provider, MD  antipyrine-benzocaine Lyla Son(AURALGAN) otic solution Place 4 drops into the right ear 4 (four) times daily as needed for ear pain.   Yes Historical Provider, MD  antiseptic oral rinse (BIOTENE) LIQD 15 mLs by Mouth Rinse route every 4 (four) hours as needed for dry mouth.   Yes Historical Provider, MD  aspirin 81 MG chewable tablet Chew 81 mg by mouth daily with breakfast.   Yes Historical Provider, MD  benzocaine-menthol (CHLORASEPTIC) 6-10 MG lozenge Take 1 lozenge by mouth every 4 (four) hours as needed for sore throat.   Yes Historical Provider, MD  bisacodyl (DULCOLAX) 10 MG suppository Place  10 mg rectally daily as needed (for constipation).   Yes Historical Provider, MD  Calcium Carbonate 500 MG CHEW Chew 1 tablet by mouth daily with breakfast.   Yes Historical Provider, MD  docusate sodium (COLACE) 100 MG capsule Take 100 mg by mouth 2 (two) times daily.   Yes Historical Provider, MD  escitalopram (LEXAPRO) 10 MG tablet Take 10 mg by mouth daily with breakfast.   Yes Historical Provider, MD  fentaNYL (DURAGESIC) 12 MCG/HR Place 12 mcg onto the skin every 3 (three) days.   Yes Historical  Provider, MD  LORazepam (ATIVAN) 0.5 MG tablet Take 0.25 mg by mouth every 6 (six) hours as needed for anxiety.   Yes Historical Provider, MD  morphine (ROXANOL) 20 MG/ML concentrated solution Take by mouth once. Dose is 0.125   Yes Historical Provider, MD  Multiple Vitamins-Minerals (MULTIVITAMINS) CHEW Chew 1 tablet by mouth daily with breakfast.   Yes Historical Provider, MD  omeprazole (PRILOSEC) 40 MG capsule Take 40 mg by mouth daily with breakfast.   Yes Historical Provider, MD  OVER THE COUNTER MEDICATION Take 1 Bottle by mouth 2 (two) times daily. Magic Cup with lunch and supper   Yes Historical Provider, MD  phenol (CHLORASEPTIC) 1.4 % LIQD Use as directed 1 spray in the mouth or throat every 2 (two) hours as needed for throat irritation / pain.   Yes Historical Provider, MD  polyethylene glycol (MIRALAX / GLYCOLAX) packet Take 17 g by mouth daily with breakfast.   Yes Historical Provider, MD  traMADol-acetaminophen (ULTRACET) 37.5-325 MG per tablet Take 1 tablet by mouth 2 (two) times daily. Takes at 0800 and 1800   Yes Historical Provider, MD  Vitamin D, Ergocalciferol, (DRISDOL) 50000 UNITS CAPS capsule Take 50,000 Units by mouth every 30 (thirty) days. Takes on the 15th of every month   Yes Historical Provider, MD    Physical Exam: Filed Vitals:   02/18/14 1601 02/18/14 1604 02/18/14 1802 02/18/14 1920  BP:  160/106 152/79 174/82  Pulse:  107 88 87  Temp:  98.6 F (37 C)  98.9 F (37.2 C)  TempSrc:  Oral  Oral  Resp:  20 20 19   SpO2: 98% 95% 100% 94%     General:  Moderately built and nourished.  Eyes: Anicteric no pallor.  ENT: No discharge from the ears eyes nose mouth.  Neck: No mass felt.  Cardiovascular: S1-S2 heard.  Respiratory: No rhonchi or crepitations.  Abdomen: Soft nontender bowel sounds present. No guarding rigidity.  Skin: As per the reports patient has decubitus ulcer but I am not able to examine her back at this time.  Musculoskeletal:  Patient's right lower extremity is internally rotated.  Psychiatric: Patient is alert awake.  Neurologic: Patient is alert awake oriented to her name and follows commands. Moves all extremities. No facial asymmetry.  Labs on Admission:  Basic Metabolic Panel:  Recent Labs Lab 02/18/14 1720  NA 137  K 4.2  CL 102  CO2 21  GLUCOSE 173*  BUN 21  CREATININE 0.87  CALCIUM 9.3   Liver Function Tests: No results found for this basename: AST, ALT, ALKPHOS, BILITOT, PROT, ALBUMIN,  in the last 168 hours No results found for this basename: LIPASE, AMYLASE,  in the last 168 hours No results found for this basename: AMMONIA,  in the last 168 hours CBC:  Recent Labs Lab 02/18/14 1720  WBC 12.2*  HGB 12.1  HCT 36.3  MCV 95.8  PLT 328   Cardiac Enzymes:  No results found for this basename: CKTOTAL, CKMB, CKMBINDEX, TROPONINI,  in the last 168 hours  BNP (last 3 results) No results found for this basename: PROBNP,  in the last 8760 hours CBG: No results found for this basename: GLUCAP,  in the last 168 hours  Radiological Exams on Admission: Dg Pelvis 1-2 Views  02/18/2014   CLINICAL DATA:  Trauma  EXAM: PELVIS - 1-2 VIEW  COMPARISON:  Prior CT from 10/18/2008  FINDINGS: Sequelae of prior ORIF seen at the right hip. There is an acute subcapital fracture through the right femoral neck with superior subluxation of the proximal femoral shaft. The fracture traverses the fixation hardware. The hardware itself remains intact. The right femoral head remains aligned within the acetabulum. The femoral head itself is partially collapsed.  An additional acute oblique fracture through the proximal right femoral shaft is present without significant displacement.  Severe osteopenia present. Degenerative changes present within the lower lumbar spine. No other fracture seen within the pelvis.  IMPRESSION: 1. Acute fracture through the right femoral neck with superior subluxation. This fracture extends  through fixation hardware from prior ORIF at the right hip. The hardware itself but remains intact. 2. Additional acute nondisplaced oblique fracture through the proximal shaft of the right femur. 3. Severe osteopenia.   Electronically Signed   By: Rise Mu M.D.   On: 02/18/2014 17:25   Dg Femur Right  02/18/2014   CLINICAL DATA:  HIP INJURY LEG INJURY  EXAM: RIGHT FEMUR - 2 VIEW  COMPARISON:  Prior study from 03/27/2008  FINDINGS: Sequelae of prior ORIF seen at the right hip. There is an acute fracture through the right femoral neck with superior subluxation of the right femoral shaft. Additional acute oblique fracture through the proximal shaft of the right femur is present. The right femoral head remains within the right acetabulum.  Diffuse osteopenia noted.  No soft tissue abnormality.  IMPRESSION: 1. Acute oblique fracture through the proximal right femoral shaft. 2. Additional acute fracture through the right femoral neck with superior subluxation. 3. Sequelae of prior ORIF at the right hip. The proximal fracture through the femoral neck traverses the fixation hardware. The hardware itself is intact. 4. Diffuse osteopenia.   Electronically Signed   By: Rise Mu M.D.   On: 02/18/2014 17:20   Dg Knee Complete 4 Views Right  02/18/2014   CLINICAL DATA:  Trauma  EXAM: RIGHT KNEE - COMPLETE 4+ VIEW  COMPARISON:  Prior radiograph from 03/27/2008.  FINDINGS: No acute fracture or dislocation. No joint effusion. The knee is in anatomic alignment.  Severe osteopenia is present.  IMPRESSION: 1. No acute fracture or dislocation. 2. Severe osteopenia.   Electronically Signed   By: Rise Mu M.D.   On: 02/18/2014 17:27    EKG: Independently reviewed. Rhythm is difficult to assess and EKG but rate is controlled with nonspecific ST-T changes. Monitor shows sinus rhythm.  Assessment/Plan Active Problems:   Femur fracture, right   Atrial fibrillation   Elevated blood pressure    Hip fracture   1. Right hip and right femur shaft fracture - orthopedic consult has been requested. Patient will be kept n.p.o. past midnight in anticipation of possible surgery. Continue with pain relief medications. 2. Atrial fibrillation presently in sinus rhythm - not on Coumadin. Presently rate controlled. 3. Elevated blood pressure - may be a reaction to pain. Continue with adequate pain control. When necessary IV hydralazine for systolic blood pressure more than 160. 4. Leukocytosis -  check chest x-ray and UA and urine culture. If there is any evidence of infection we'll start empiric antibiotics. 5. Dementia. 6. History of esophageal strictures/varices.  Difficult to assess patient's back for any decubitus ulcers in this position. Kindly reassess for decubitus ulcers when patient is on the floor.  Code Status: DO NOT RESUSCITATE as confirmed with patient's daughter.  Family Communication: Patient's daughter at the bedside.  Disposition Plan: Admit to inpatient.    Ranger Petrich N. [facility] Pager [phone].  If 7PM-7AM, please contact night-coverage [email or web address] Password [email or web address] 02/18/2014, 8:22 PM

## 2014-02-19 DIAGNOSIS — R030 Elevated blood-pressure reading, without diagnosis of hypertension: Secondary | ICD-10-CM

## 2014-02-19 DIAGNOSIS — IMO0002 Reserved for concepts with insufficient information to code with codable children: Secondary | ICD-10-CM

## 2014-02-19 LAB — CBC
HEMATOCRIT: 34.5 % — AB (ref 36.0–46.0)
HEMOGLOBIN: 11.5 g/dL — AB (ref 12.0–15.0)
MCH: 31.9 pg (ref 26.0–34.0)
MCHC: 33.3 g/dL (ref 30.0–36.0)
MCV: 95.6 fL (ref 78.0–100.0)
Platelets: 331 10*3/uL (ref 150–400)
RBC: 3.61 MIL/uL — ABNORMAL LOW (ref 3.87–5.11)
RDW: 13.7 % (ref 11.5–15.5)
WBC: 9.7 10*3/uL (ref 4.0–10.5)

## 2014-02-19 LAB — COMPREHENSIVE METABOLIC PANEL
ALT: 9 U/L (ref 0–35)
ANION GAP: 10 (ref 5–15)
AST: 30 U/L (ref 0–37)
Albumin: 3.3 g/dL — ABNORMAL LOW (ref 3.5–5.2)
Alkaline Phosphatase: 93 U/L (ref 39–117)
BUN: 21 mg/dL (ref 6–23)
CALCIUM: 9.4 mg/dL (ref 8.4–10.5)
CO2: 28 mEq/L (ref 19–32)
Chloride: 100 mEq/L (ref 96–112)
Creatinine, Ser: 0.87 mg/dL (ref 0.50–1.10)
GFR calc Af Amer: 64 mL/min — ABNORMAL LOW (ref >90)
GFR calc non Af Amer: 55 mL/min — ABNORMAL LOW (ref >90)
GLUCOSE: 109 mg/dL — AB (ref 70–99)
Potassium: 4.4 mEq/L (ref 3.7–5.3)
Sodium: 138 mEq/L (ref 137–147)
TOTAL PROTEIN: 7.2 g/dL (ref 6.0–8.3)
Total Bilirubin: 0.4 mg/dL (ref 0.3–1.2)

## 2014-02-19 LAB — URINE CULTURE
COLONY COUNT: NO GROWTH
CULTURE: NO GROWTH

## 2014-02-19 LAB — VITAMIN D 25 HYDROXY (VIT D DEFICIENCY, FRACTURES): Vit D, 25-Hydroxy: 50 ng/mL (ref 30–89)

## 2014-02-19 MED ORDER — DOCUSATE SODIUM 50 MG/5ML PO LIQD
100.0000 mg | Freq: Two times a day (BID) | ORAL | Status: DC
  Administered 2014-02-19 – 2014-02-20 (×2): 100 mg via ORAL
  Filled 2014-02-19 (×3): qty 10

## 2014-02-19 MED ORDER — SODIUM CHLORIDE 0.9 % IV SOLN: INTRAVENOUS | Status: DC

## 2014-02-19 MED ORDER — BIOTENE DRY MOUTH MT LIQD
15.0000 mL | Freq: Two times a day (BID) | OROMUCOSAL | Status: DC
  Administered 2014-02-19 (×2): 15 mL via OROMUCOSAL

## 2014-02-19 NOTE — Progress Notes (Signed)
[id number]/Rhonda Earlene PlaterDavis, RN,BSN,CC:: [id number] Chart reviewed for patient status and needs. No discharge needs present at time of review. Next review due on 072015.

## 2014-02-19 NOTE — Progress Notes (Signed)
INITIAL NUTRITION ASSESSMENT  DOCUMENTATION CODES Per approved criteria  -Not Applicable   INTERVENTION: - Diet advancement per MD - Encouraged high calorie/protein diet once diet advanced to promote strength building - RD to continue to monitor   NUTRITION DIAGNOSIS: Inadequate oral intake related to inability to eat as evidenced by NPO.   Goal: Advance diet as tolerated to dysphagia 3/thin as pt on this diet PTA  Monitor:  Weights, labs, diet advancement   Reason for Assessment: Malnutrition screening tool, consult for assessment   80 y.o. female  Admitting Dx: Right femur and hip fracture   ASSESSMENT: Pt with history of dementia, atrial fibrillation, esophageal strictures, CVA was brought to the ER from nursing home after patient complained of right hip pain. Found to have right femoral shaft fracture.   - Pt asleep during visit, daughter present at bedside - She reports pt has had her esophagus stretched twice and was on a mechanical soft diet [facility] - States staff reported pt usually ate a good breakfast but had variable lunch and dinner intake - Was not on any nutritional supplements because pt complained that Ensure and Boost "tasted like slime" - Daughter states pt has lost 100 pounds since 2010 due to poor appetite after multiple surgeries and loss of pt's older brother, treated for this with Lexapro - Observed pt with some mild fat loss in clavicles, did not awaken for full physical exam     Height: Ht Readings from Last 1 Encounters:  02/19/14 4\' 11"  (1.499 m)    Weight: Wt Readings from Last 1 Encounters:  02/18/14 122 lb 12.7 oz (55.7 kg)    Ideal Body Weight: 98 lbs   % Ideal Body Weight: 124%  Wt Readings from Last 10 Encounters:  02/18/14 122 lb 12.7 oz (55.7 kg)    Usual Body Weight: 222 lbs in 2010 per daughter   % Usual Body Weight: 55%  BMI:  Body mass index is 24.79 kg/(m^2).  Estimated Nutritional Needs: Kcal:  1350-1550 Protein: 65-80g Fluid: 1.3-1.5L/day   Skin: intact   Diet Order: NPO  EDUCATION NEEDS: -No education needs identified at this time   Intake/Output Summary (Last 24 hours) at 02/19/14 1155 Last data filed at 02/19/14 0750  Gross per 24 hour  Intake      0 ml  Output    450 ml  Net   -450 ml    Last BM: PTA  Labs:   Recent Labs Lab 02/18/14 1720 02/19/14 0710  NA 137 138  K 4.2 4.4  CL 102 100  CO2 21 28  BUN 21 21  CREATININE 0.87 0.87  CALCIUM 9.3 9.4  GLUCOSE 173* 109*    CBG (last 3)  No results found for this basename: GLUCAP,  in the last 72 hours  Scheduled Meds: . antiseptic oral rinse  15 mL Mouth Rinse q12n4p  . calcium carbonate  1 tablet Oral Q breakfast  . ceFEPime (MAXIPIME) IV  1 g Intravenous Q12H  . docusate sodium  100 mg Oral BID  . escitalopram  10 mg Oral Q breakfast  . fentaNYL  12.5 mcg Transdermal Q72H  . multivitamin  5 mL Oral QAC breakfast  . pantoprazole  40 mg Oral Daily  . polyethylene glycol  17 g Oral Q breakfast  . traMADol-acetaminophen  1 tablet Oral BID  . vancomycin  750 mg Intravenous Q24H    Continuous Infusions:   Past Medical History  Diagnosis Date  . Atrial  fibrillation   . Esophageal reflux   . Hypertension   . Constipation   . Cholelithiasis   . Hemorrhoids   . Esophageal varices   . Dementia   . Depression   . Anemia   . Osteoarthritis   . Hyperlipidemia   . Sialolithiasis   . GERD (gastroesophageal reflux disease)   . Urinary incontinence   . Recurrent UTI (urinary tract infection)   . Chronic renal insufficiency   . CVA (cerebral infarction)   . Cataract   . Osteoporosis   . Hip fracture     Past Surgical History  Procedure Laterality Date  . Hip fracture surgery Right     Charlott Rakes MS, RD, LDN [phone] Pager [phone] Weekend/After Hours Pager

## 2014-02-19 NOTE — Progress Notes (Signed)
PROGRESS NOTE    [name] A Valade ZOX:[id number] DOB: 1917/02/24 DOA: 02/18/2014 PCP: No primary provider on file.  HPI/Brief narrative 80 y.o. female with history of dementia, atrial fibrillation, esophageal strictures, CVA was brought to the ER from nursing home after patient complained of right hip pain and found to have right femoral fracture. Orthopedics consulted   Assessment/Plan:  1. 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 Patient will be a high risk candidate for surgery. Await final orthopedic recommendations. Pain control. Discussed at length with patient's daughter and grandson at bedside. 2. Atrial fibrillation: In sinus rhythm. Not on Coumadin. 3. Elevated blood pressure: Likely secondary to pain. Improved. 4. Leukocytosis: Likely stress induced. Resolved. In the absence of symptoms, fever, leukocytosis, negative UA and unremarkable chest x-ray, will DC IV antibiotics. 5. Dementia 6. Anemia: Follow CBCs   Code Status: DO NOT RESUSCITATE Family Communication: Discussed with patients daughter/health care power of attorney and grandson at bedside. Disposition Plan: To be determined.   Consultants:  Orthopedics  Procedures:  None  Antibiotics:  None   Subjective: Patient sleeping-did not arouse to examine this morning.  Objective: Filed Vitals:   02/19/14 0959 02/19/14 1100 02/19/14 1200 02/19/14 1402  BP: 139/66   124/62  Pulse: 70   67  Temp: 96.4 F (35.8 C)   99 F (37.2 C)  TempSrc: Axillary   Oral  Resp: 16  18 16   Height:  4\' 11"  (1.499 m)    Weight:      SpO2: 94%   95%    Intake/Output Summary (Last 24 hours) at 02/19/14 1650 Last data filed at 02/19/14 1403  Gross per 24 hour  Intake      0 ml    Output    525 ml  Net   -525 ml   Filed Weights   02/18/14 2131  Weight: 55.7 kg (122 lb 12.7 oz)     Exam:  General exam: Elderly female lying comfortably in bed. Respiratory system: Clear. No increased work of breathing. Cardiovascular system: S1 & S2 heard, RRR. No JVD, murmurs, gallops, clicks or pedal edema. Gastrointestinal system: Abdomen is nondistended, soft and nontender. Normal bowel sounds heard. Central nervous system: Sleeping. Did not arouse her to examine. No focal neurological deficits. Extremities: Lower extremities held in crouched position.   Data Reviewed: Basic Metabolic Panel:  Recent Labs Lab 02/18/14 1720 02/19/14 0710  NA 137 138  K 4.2 4.4  CL 102 100  CO2 21 28  GLUCOSE 173* 109*  BUN 21 21  CREATININE 0.87 0.87  CALCIUM 9.3 9.4   Liver Function Tests:  Recent Labs Lab 02/19/14 0710  AST 30  ALT 9  ALKPHOS 93  BILITOT 0.4  PROT 7.2  ALBUMIN 3.3*   No results found for this basename: LIPASE, AMYLASE,  in the last 168 hours No results found for this basename: AMMONIA,  in the last 168 hours CBC:  Recent Labs Lab 02/18/14 1720 02/19/14 0710  WBC 12.2* 9.7  HGB 12.1 11.5*  HCT 36.3 34.5*  MCV 95.8 95.6  PLT 328 331   Cardiac Enzymes: No results found for this basename: CKTOTAL, CKMB, CKMBINDEX, TROPONINI,  in the last 168 hours BNP (last 3 results) No results found for this basename: PROBNP,  in the last 8760  hours CBG: No results found for this basename: GLUCAP,  in the last 168 hours  No results found for this or any previous visit (from the past 240 hour(s)).       Studies: Dg Pelvis 1-2 Views  02/18/2014   CLINICAL DATA:  Trauma  EXAM: PELVIS - 1-2 VIEW  COMPARISON:  Prior CT from 10/18/2008  FINDINGS: Sequelae of prior ORIF seen at the right hip. There is an acute subcapital fracture through the right femoral neck with superior subluxation of the proximal femoral shaft. The fracture traverses the fixation  hardware. The hardware itself remains intact. The right femoral head remains aligned within the acetabulum. The femoral head itself is partially collapsed.  An additional acute oblique fracture through the proximal right femoral shaft is present without significant displacement.  Severe osteopenia present. Degenerative changes present within the lower lumbar spine. No other fracture seen within the pelvis.  IMPRESSION: 1. Acute fracture through the right femoral neck with superior subluxation. This fracture extends through fixation hardware from prior ORIF at the right hip. The hardware itself but remains intact. 2. Additional acute nondisplaced oblique fracture through the proximal shaft of the right femur. 3. Severe osteopenia.   Electronically Signed   By: Rise Mu M.D.   On: 02/18/2014 17:25   Dg Femur Right  02/18/2014   CLINICAL DATA:  HIP INJURY LEG INJURY  EXAM: RIGHT FEMUR - 2 VIEW  COMPARISON:  Prior study from 03/27/2008  FINDINGS: Sequelae of prior ORIF seen at the right hip. There is an acute fracture through the right femoral neck with superior subluxation of the right femoral shaft. Additional acute oblique fracture through the proximal shaft of the right femur is present. The right femoral head remains within the right acetabulum.  Diffuse osteopenia noted.  No soft tissue abnormality.  IMPRESSION: 1. Acute oblique fracture through the proximal right femoral shaft. 2. Additional acute fracture through the right femoral neck with superior subluxation. 3. Sequelae of prior ORIF at the right hip. The proximal fracture through the femoral neck traverses the fixation hardware. The hardware itself is intact. 4. Diffuse osteopenia.   Electronically Signed   By: Rise Mu M.D.   On: 02/18/2014 17:20   Dg Chest Port 1 View  02/18/2014   CLINICAL DATA:  Shortness of breath and weakness.  EXAM: PORTABLE CHEST - 1 VIEW  COMPARISON:  03/24/2008  FINDINGS: Lungs are somewhat  hypoinflated without effusion. There is minimal hazy density in the left retrocardiac region. There is mild cardiomegaly. There is calcified plaque over the thoracic aorta. Left shoulder arthroplasty unchanged. There are moderate degenerative changes of the spine.  IMPRESSION: Minimal hazy density over the left retrocardiac region as cannot exclude atelectasis/effusion versus infection. Consider PA and lateral chest radiograph for better evaluation of the left base.  Mild cardiomegaly.   Electronically Signed   By: Elberta Fortis M.D.   On: 02/18/2014 21:02   Dg Knee Complete 4 Views Right  02/18/2014   CLINICAL DATA:  Trauma  EXAM: RIGHT KNEE - COMPLETE 4+ VIEW  COMPARISON:  Prior radiograph from 03/27/2008.  FINDINGS: No acute fracture or dislocation. No joint effusion. The knee is in anatomic alignment.  Severe osteopenia is present.  IMPRESSION: 1. No acute fracture or dislocation. 2. Severe osteopenia.   Electronically Signed   By: Rise Mu M.D.   On: 02/18/2014 17:27        Scheduled Meds: . antiseptic oral rinse  15 mL Mouth Rinse q12n4p  .  calcium carbonate  1 tablet Oral Q breakfast  . ceFEPime (MAXIPIME) IV  1 g Intravenous Q12H  . docusate sodium  100 mg Oral BID  . escitalopram  10 mg Oral Q breakfast  . fentaNYL  12.5 mcg Transdermal Q72H  . multivitamin  5 mL Oral QAC breakfast  . pantoprazole  40 mg Oral Daily  . polyethylene glycol  17 g Oral Q breakfast  . traMADol-acetaminophen  1 tablet Oral BID  . vancomycin  750 mg Intravenous Q24H   Continuous Infusions:   Active Problems:   Femur fracture, right   Atrial fibrillation   Elevated blood pressure   Hip fracture    Time spent: 30 minutes    Kory Panjwani, MD, FACP, FHM. [facility] Pager [phone]  If 7PM-7AM, please contact night-coverage [email or web address] Password [email or web address] 02/19/2014, 4:50 PM    LOS: 1 day

## 2014-02-19 NOTE — Progress Notes (Signed)
Clinical Social Work Department BRIEF PSYCHOSOCIAL ASSESSMENT 02/19/2014  Patient:  Darrel HooverMACON,Alyssa A     Account Number:  [id number]     Admit date:  02/18/2014  Clinical Social Worker:  Candie ChromanHAIDINGER,Lissa Rowles, LCSW  Date/Time:  02/19/2014 02:12 PM  Referred by:  Physician  Date Referred:  02/19/2014 Referred for  SNF Placement   Other Referral:   Interview type:  Family Other interview type:    PSYCHOSOCIAL DATA Living Status:  FACILITY Admitted from facility:  [facility], [place] Level of care:  Skilled Nursing Facility Primary support name:  Vinie SillMartha Hall Primary support relationship to patient:  CHILD, ADULT Degree of support available:   supportive    CURRENT CONCERNS Current Concerns  Post-Acute Placement   Other Concerns:    SOCIAL WORK ASSESSMENT / PLAN Pt is a 80 yr old female admitted from [facility] ( [place] ) following a fall which resulted in a right femoral shaft fx. PN reviewed. Ortho is following and does not recommend surgery at this time. Pt has a hx of dementia. Daughter Vinie Sill( Martha Hall ) is POA. Daughter has been contacted to assist with d/c planning. Family would like pt to  return to [facility] following hospital d/c. CSW has contacted SNF and d/c plan has been confirmed. CSW will continue to follow to assist with d/c planning to SNF.   Assessment/plan status:  Psychosocial Support/Ongoing Assessment of Needs Other assessment/ plan:   Information/referral to community resources:   None needed at this time.    PATIENT'S/FAMILY'S RESPONSE TO PLAN OF CARE: Family has been very pleased with the care pt has received at [facility]. They are looking forward to pt feeling better and returning to SNF.   Cori RazorJamie Handsome Anglin LCSW [phone]

## 2014-02-19 NOTE — Progress Notes (Signed)
ANTIBIOTIC CONSULT NOTE - INITIAL  Pharmacy Consult for Cefepime/Vancomyin Indication: HCAP  Allergies  Allergen Reactions  . Codeine     Per MAR  . Septra [Sulfamethoxazole-Trimethoprim]     Per MAR  . Wellbutrin [Bupropion]     [facility]    Patient Measurements: Weight: 122 lb 12.7 oz (55.7 kg)  Vital Signs: Temp: 98.1 F (36.7 C) (07/17 0024) Temp src: Oral (07/17 0024) BP: 154/88 mmHg (07/17 0024) Pulse Rate: 84 (07/17 0024) Intake/Output from previous day:   Intake/Output from this shift:    Labs:  Recent Labs  02/18/14 1720  WBC 12.2*  HGB 12.1  PLT 328  CREATININE 0.87   CrCl is unknown because there is no height on file for the current visit. No results found for this basename: VANCOTROUGH, VANCOPEAK, VANCORANDOM, GENTTROUGH, GENTPEAK, GENTRANDOM, TOBRATROUGH, TOBRAPEAK, TOBRARND, AMIKACINPEAK, AMIKACINTROU, AMIKACIN,  in the last 72 hours   Microbiology: No results found for this or any previous visit (from the past 720 hour(s)).  Medical History: Past Medical History  Diagnosis Date  . Atrial fibrillation   . Esophageal reflux   . Hypertension   . Constipation   . Cholelithiasis   . Hemorrhoids   . Esophageal varices   . Dementia   . Depression   . Anemia   . Osteoarthritis   . Hyperlipidemia   . Sialolithiasis   . GERD (gastroesophageal reflux disease)   . Urinary incontinence   . Recurrent UTI (urinary tract infection)   . Chronic renal insufficiency   . CVA (cerebral infarction)   . Cataract   . Osteoporosis   . Hip fracture     Medications:  Scheduled:  . calcium carbonate  1 tablet Oral Q breakfast  . ceFEPime (MAXIPIME) IV  1 g Intravenous Q12H  . docusate sodium  100 mg Oral BID  . escitalopram  10 mg Oral Q breakfast  . fentaNYL  12.5 mcg Transdermal Q72H  . multivitamin  5 mL Oral QAC breakfast  . pantoprazole  40 mg Oral Daily  . polyethylene glycol  17 g Oral Q breakfast  . traMADol-acetaminophen  1 tablet Oral BID   . vancomycin  750 mg Intravenous Q24H   Infusions:   Assessment: 80 yo with hx dementia, A-fib, esophageal strictures, CVA c/o right hip pain/fracture.  MD ordering Cefepime and Vancomycin per Rx for HCAP.   Goal of Therapy:  Vancomycin trough level 15-20 mcg/ml  Plan:   Cefepime 1Gm IV q12h  Vancomycin 750mg  IV q24h  F/U SCr/cultures/levels as needed  Susanne GreenhouseGreen, Lakyn Alsteen R 02/19/2014,12:40 AM

## 2014-02-19 NOTE — Progress Notes (Signed)
Examination is a 80 year old patient who is nonambulatory. Currently admitted for right femoral shaft fracture below a DHS placed 6 years ago. In discussing this with her family members it appears that DHS was placed for a baby cervical fracture and the fracture never healed. This is consistent with x-ray findings of sclerosis and when showed wiper effect in the femoral head. This is also consistent with the patient's reported history through her daughter that she felt the screw moving within the hip bone. Subsequently she developed contracture from varus collapse of the fracture. When she was having a Foley placed she developed an iatrogenic fracture of the femur just distal to the plate.  On examination today the patient does have some pain with movement of the right hip screw itself is not critically prominent she has about 60 flexion contractures in both knees. Pedal pulses are palpable. She has well-healed surgical incision on the right hip area.  Rate rest are reviewed in addition oh a transverse type fracture below a plate. 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 Plan for long talk with the family today about operative and nonoperative options. Operative options I think would be limited in her case to hardware removal and girdle stone procedure. After being nonweightbearing for 5 years her bone quality is poor and mechanism of injury was very low energy to develop a femur fracture. Alternatively you we could just see how she does with a nonunion on the right-hand side. It would be a somewhat extensive surgery to do a Girdlestone on this patient. At this time family's decision as to pursue nonoperative treatment with the option to perform hardware removal and girdle stone procedure a later date if needed. I will check  on her again Monday.

## 2014-02-20 DIAGNOSIS — F0391 Unspecified dementia with behavioral disturbance: Secondary | ICD-10-CM

## 2014-02-20 DIAGNOSIS — F3289 Other specified depressive episodes: Secondary | ICD-10-CM

## 2014-02-20 DIAGNOSIS — F03918 Unspecified dementia, unspecified severity, with other behavioral disturbance: Secondary | ICD-10-CM

## 2014-02-20 DIAGNOSIS — F329 Major depressive disorder, single episode, unspecified: Secondary | ICD-10-CM

## 2014-02-20 LAB — CBC
HEMATOCRIT: 30.5 % — AB (ref 36.0–46.0)
Hemoglobin: 9.8 g/dL — ABNORMAL LOW (ref 12.0–15.0)
MCH: 31.3 pg (ref 26.0–34.0)
MCHC: 32.1 g/dL (ref 30.0–36.0)
MCV: 97.4 fL (ref 78.0–100.0)
Platelets: 291 10*3/uL (ref 150–400)
RBC: 3.13 MIL/uL — ABNORMAL LOW (ref 3.87–5.11)
RDW: 13.9 % (ref 11.5–15.5)
WBC: 7.9 10*3/uL (ref 4.0–10.5)

## 2014-02-20 MED ORDER — TRAMADOL-ACETAMINOPHEN 37.5-325 MG PO TABS: 1.0000 | ORAL_TABLET | Freq: Three times a day (TID) | ORAL | Status: AC | PRN

## 2014-02-20 MED ORDER — LORAZEPAM 0.5 MG PO TABS: 0.2500 mg | ORAL_TABLET | Freq: Four times a day (QID) | ORAL | Status: AC | PRN

## 2014-02-20 MED ORDER — MORPHINE SULFATE (CONCENTRATE) 20 MG/ML PO SOLN: 0.2000 mg | ORAL | Status: AC | PRN

## 2014-02-20 MED ORDER — FENTANYL 12 MCG/HR TD PT72: 12.0000 ug | MEDICATED_PATCH | TRANSDERMAL | Status: AC

## 2014-02-20 NOTE — Progress Notes (Signed)
Pt transported via [facility] to [facility] in [place].  Social Worker Herbert SetaHeather was notified per her request that pt has been picked up.

## 2014-02-20 NOTE — Progress Notes (Signed)
Spoke with Dr. Ophelia CharterYates regarding pt d/c.Marland Kitchen.Marland Kitchen.Dr. Ophelia CharterYates advised to D/C pt to [facility] today and to follow up with Dr. August Saucerean.

## 2014-02-20 NOTE — Progress Notes (Signed)
CSW consult to pt regarding admission to [facility]. CSW spoke with pt/family and they are in agreement. PTAR arranged. Attending RN made aware and report called. Packet prepared. Pt appreciative of CSW assistance. No futher intervention needed.    [address]Manahil Vanzile, [place] [phone]

## 2014-02-20 NOTE — Progress Notes (Signed)
Patient ID: Darrel HooverEtta A Russell, female   DOB: May 28, 1917, 80 y.o.   MRN: [id number] Patient stable. Dr. August Saucerean to followup on Monday.

## 2014-02-20 NOTE — Discharge Summary (Signed)
Physician Discharge Summary  [name] ZOX:[id number] DOB: Nov 03, 1916 DOA: 02/18/2014  PCP: No primary provider on file.  Admit date: 02/18/2014 Discharge date: 02/20/2014  Time spent: >30 minutes  Recommendations for Outpatient Follow-up:  1. Follow pain and adjust medications as needed for control 2. Palliative care consult at nursing facility  Discharge Diagnoses:  Active Problems:   Femur fracture, right   Atrial fibrillation   Elevated blood pressure   Hip fracture   Discharge Condition: stable and improved. No pain. Will discharge back to SNF.  Diet recommendation: regular diet  Filed Weights   02/18/14 2131  Weight: 55.7 kg (122 lb 12.7 oz)    History of present illness:  80 y.o. female with history of dementia, atrial fibrillation, esophageal strictures and CVA was brought to the ER from nursing home after patient complained of right hip pain. As per the history provided by patient's daughter, patient was found to be delirious today morning and nursing home staff was planning to get a urine sample when patient started developing right hip pain in the attempt to get the urine sample. X-rays show right femoral shaft and the right hip fracture and on call orthopedic surgeon was consulted and patient was admitted for further management. Patient has been bedbound since 2010 after she had sustained a right hip fracture.   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 August Saucer to reevaluate in outpatient setting if pain difficult to control in order to decide on removal of previous prosthesis -Patient will be a high risk candidate for surgery.  2-Paroxysmal Atrial fibrillation: In sinus rhythm and with controlled rate. Not Coumadin candidate  3-Elevated  blood pressure: Likely secondary to pain on admisison. Improved and stable with pain control  4-Leukocytosis: Likely stress induced. Resolved by time of discharge. In the absence of symptoms, fever, leukocytosis, negative UA and unremarkable chest x-ray, not antibiotics required  5-dementia: with intermittent episodes of behavioral problems (depression and anxiety) -continue supportive care -continue lexapro -continue PRN ativan   Procedures: See below for x-ray reports  Consultations:  Orthopedic service  Discharge Exam: Filed Vitals:   02/20/14 1020  BP: 127/48  Pulse: 66  Temp: 98.9 F (37.2 C)  Resp: 16    General: afebrile, NAD, denies pain Cardiovascular: rate control, no rubs or gallops, positive SEM Respiratory: CTA bilaterally WJX:BJYN, NT, ND, positive BS Neuro: non focal deifcit Extremities: Lower extremities held in crouched position.  Discharge Instructions You were cared for by a hospitalist during your hospital stay. If you have any questions about your discharge medications or the care you received while you were in the hospital after you are discharged, you can call the unit and asked to speak with the hospitalist on call if the hospitalist that took care of you is not available. Once you are discharged, your primary care physician will handle any further medical issues. Please note that NO REFILLS for any discharge medications will be authorized once you are discharged, as it is imperative that you return to your primary care physician (or establish a relationship with a primary care physician if you do not have one) for your aftercare needs so that they can reassess your need for medications and monitor your lab values.  Discharge Instructions   Discharge instructions    Complete by:  As directed   Take medications as prescribed Recommending palliative care assessment/evaluation at facility Maintain good hydration Foley replaced for comfort and skin  integrity during this admission.            Medication List         antipyrine-benzocaine otic solution  Commonly known as:  AURALGAN  Place 4 drops into the right ear 4 (four) times daily as needed for ear pain.     antiseptic oral rinse Liqd  15 mLs by Mouth Rinse route every 4 (four) hours as needed for dry mouth.     aspirin 81 MG chewable tablet  Chew 81 mg by mouth daily with breakfast.     bisacodyl 10 MG suppository  Commonly known as:  DULCOLAX  Place 10 mg rectally daily as needed (for constipation).     Calcium Carbonate 500 MG Chew  Chew 1 tablet by mouth daily with breakfast.     CHLORASEPTIC 6-10 MG lozenge  Generic drug:  benzocaine-menthol  Take 1 lozenge by mouth every 4 (four) hours as needed for sore throat.     docusate sodium 100 MG capsule  Commonly known as:  COLACE  Take 100 mg by mouth 2 (two) times daily.     escitalopram 10 MG tablet  Commonly known as:  LEXAPRO  Take 10 mg by mouth daily with breakfast.     fentaNYL 12 MCG/HR  Commonly known as:  DURAGESIC  Place 1 patch (12.5 mcg total) onto the skin every 3 (three) days.     LORazepam 0.5 MG tablet  Commonly known as:  ATIVAN  Take 0.5 tablets (0.25 mg total) by mouth every 6 (six) hours as needed for anxiety.     morphine 20 MG/ML concentrated solution  Commonly known as:  ROXANOL  Take 0.01 mLs (0.2 mg total) by mouth every 3 (three) hours as needed for severe pain.     MULTIVITAMINS Chew  Chew 1 tablet by mouth daily with breakfast.     omeprazole 40 MG capsule  Commonly known as:  PRILOSEC  Take 40 mg by mouth daily with breakfast.     OVER THE COUNTER MEDICATION  Take 1 Bottle by mouth 2 (two) times daily. Magic Cup with lunch and supper     phenol 1.4 % Liqd  Commonly known as:  CHLORASEPTIC  Use as directed 1 spray in the mouth or throat every 2 (two) hours as needed for throat irritation / pain.     polyethylene glycol packet  Commonly known as:  MIRALAX / GLYCOLAX   Take 17 g by mouth daily with breakfast.     traMADol-acetaminophen 37.5-325 MG per tablet  Commonly known as:  ULTRACET  Take 1 tablet by mouth every 8 (eight) hours as needed for moderate pain.     TYLENOL 325 MG tablet  Generic drug:  acetaminophen  Take 650 mg by mouth every 4 (four) hours as needed for mild pain or fever.     Vitamin D (Ergocalciferol) 50000 UNITS Caps capsule  Commonly known as:  DRISDOL  Take 50,000 Units by mouth every 30 (thirty) days. Takes on the 15th of every month       Allergies  Allergen Reactions  . Codeine     Per MAR  . Septra [Sulfamethoxazole-Trimethoprim]     Per MAR  . Wellbutrin [Bupropion]     Per MAR    The results of significant diagnostics from this hospitalization (including imaging, microbiology, ancillary and laboratory) are  listed below for reference.    Significant Diagnostic Studies: Dg Pelvis 1-2 Views  02/18/2014   CLINICAL DATA:  Trauma  EXAM: PELVIS - 1-2 VIEW  COMPARISON:  Prior CT from 10/18/2008  FINDINGS: Sequelae of prior ORIF seen at the right hip. There is an acute subcapital fracture through the right femoral neck with superior subluxation of the proximal femoral shaft. The fracture traverses the fixation hardware. The hardware itself remains intact. The right femoral head remains aligned within the acetabulum. The femoral head itself is partially collapsed.  An additional acute oblique fracture through the proximal right femoral shaft is present without significant displacement.  Severe osteopenia present. Degenerative changes present within the lower lumbar spine. No other fracture seen within the pelvis.  IMPRESSION: 1. Acute fracture through the right femoral neck with superior subluxation. This fracture extends through fixation hardware from prior ORIF at the right hip. The hardware itself but remains intact. 2. Additional acute nondisplaced oblique fracture through the proximal shaft of the right femur. 3. Severe  osteopenia.   Electronically Signed   By: Rise MuBenjamin  McClintock M.D.   On: 02/18/2014 17:25   Dg Femur Right  02/18/2014   CLINICAL DATA:  HIP INJURY LEG INJURY  EXAM: RIGHT FEMUR - 2 VIEW  COMPARISON:  Prior study from 03/27/2008  FINDINGS: Sequelae of prior ORIF seen at the right hip. There is an acute fracture through the right femoral neck with superior subluxation of the right femoral shaft. Additional acute oblique fracture through the proximal shaft of the right femur is present. The right femoral head remains within the right acetabulum.  Diffuse osteopenia noted.  No soft tissue abnormality.  IMPRESSION: 1. Acute oblique fracture through the proximal right femoral shaft. 2. Additional acute fracture through the right femoral neck with superior subluxation. 3. Sequelae of prior ORIF at the right hip. The proximal fracture through the femoral neck traverses the fixation hardware. The hardware itself is intact. 4. Diffuse osteopenia.   Electronically Signed   By: Rise MuBenjamin  McClintock M.D.   On: 02/18/2014 17:20   Dg Chest Port 1 View  02/18/2014   CLINICAL DATA:  Shortness of breath and weakness.  EXAM: PORTABLE CHEST - 1 VIEW  COMPARISON:  03/24/2008  FINDINGS: Lungs are somewhat hypoinflated without effusion. There is minimal hazy density in the left retrocardiac region. There is mild cardiomegaly. There is calcified plaque over the thoracic aorta. Left shoulder arthroplasty unchanged. There are moderate degenerative changes of the spine.  IMPRESSION: Minimal hazy density over the left retrocardiac region as cannot exclude atelectasis/effusion versus infection. Consider PA and lateral chest radiograph for better evaluation of the left base.  Mild cardiomegaly.   Electronically Signed   By: Elberta Fortisaniel  Boyle M.D.   On: 02/18/2014 21:02   Dg Knee Complete 4 Views Right  02/18/2014   CLINICAL DATA:  Trauma  EXAM: RIGHT KNEE - COMPLETE 4+ VIEW  COMPARISON:  Prior radiograph from 03/27/2008.  FINDINGS: No  acute fracture or dislocation. No joint effusion. The knee is in anatomic alignment.  Severe osteopenia is present.  IMPRESSION: 1. No acute fracture or dislocation. 2. Severe osteopenia.   Electronically Signed   By: Rise MuBenjamin  McClintock M.D.   On: 02/18/2014 17:27    Microbiology: Recent Results (from the past 240 hour(s))  URINE CULTURE     Status: None   Collection Time    02/18/14  9:58 PM      Result Value Ref Range Status   Specimen Description URINE, CATHETERIZED  Final   Special Requests NONE   Final   Culture  Setup Time     Final   Value: 02/19/2014 02:04     Performed at [facility]   Colony Count     Final   Value: NO GROWTH     Performed at [facility]   Culture     Final   Value: NO GROWTH     Performed at [facility]   Report Status 02/19/2014 FINAL   Final     Labs: Basic Metabolic Panel:  Recent Labs Lab 02/18/14 1720 02/19/14 0710  NA 137 138  K 4.2 4.4  CL 102 100  CO2 21 28  GLUCOSE 173* 109*  BUN 21 21  CREATININE 0.87 0.87  CALCIUM 9.3 9.4   Liver Function Tests:  Recent Labs Lab 02/19/14 0710  AST 30  ALT 9  ALKPHOS 93  BILITOT 0.4  PROT 7.2  ALBUMIN 3.3*   CBC:  Recent Labs Lab 02/18/14 1720 02/19/14 0710 02/20/14 0510  WBC 12.2* 9.7 7.9  HGB 12.1 11.5* 9.8*  HCT 36.3 34.5* 30.5*  MCV 95.8 95.6 97.4  PLT 328 331 291   Signed:  Vassie Loll  [facility] 02/20/2014, 12:38 PM

## 2014-02-20 NOTE — Progress Notes (Signed)
Report called to Florentina AddisonKatie, Charity fundraiserN [facility].

## 2015-12-08 IMAGING — CR DG FEMUR 2+V*R*
5 series · 5 of 5 positions shown · non-contrast
Comparison: Prior study from 03/27/2008

CLINICAL DATA: HIP INJURY LEG INJURY

EXAM:
RIGHT FEMUR - 2 VIEW

[t femur proximal ap right]
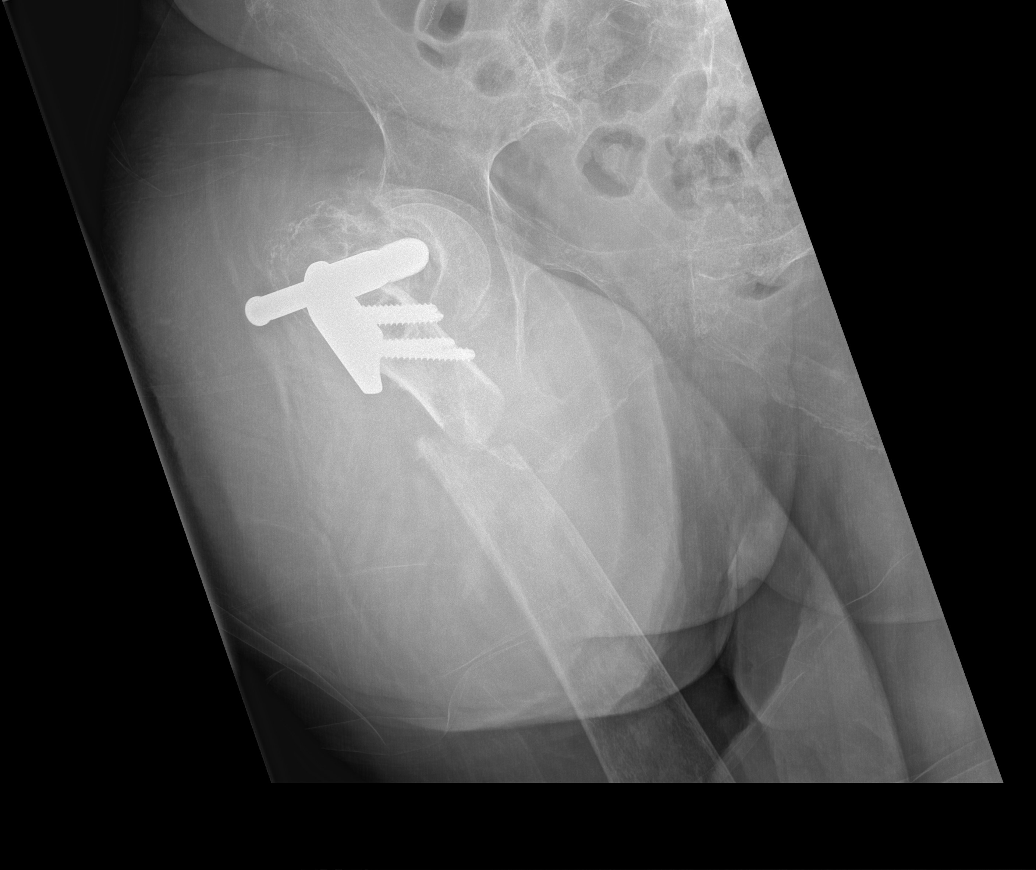

[t femur distal ap right]
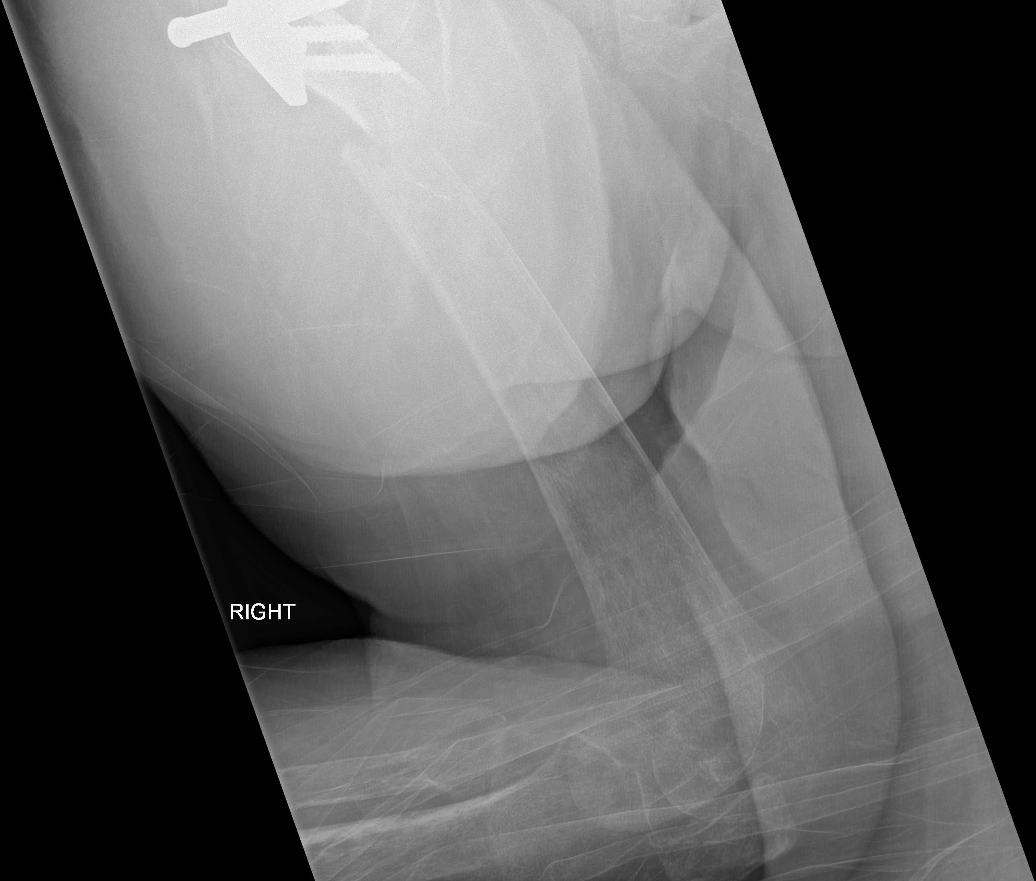

[x femur distal lat right]
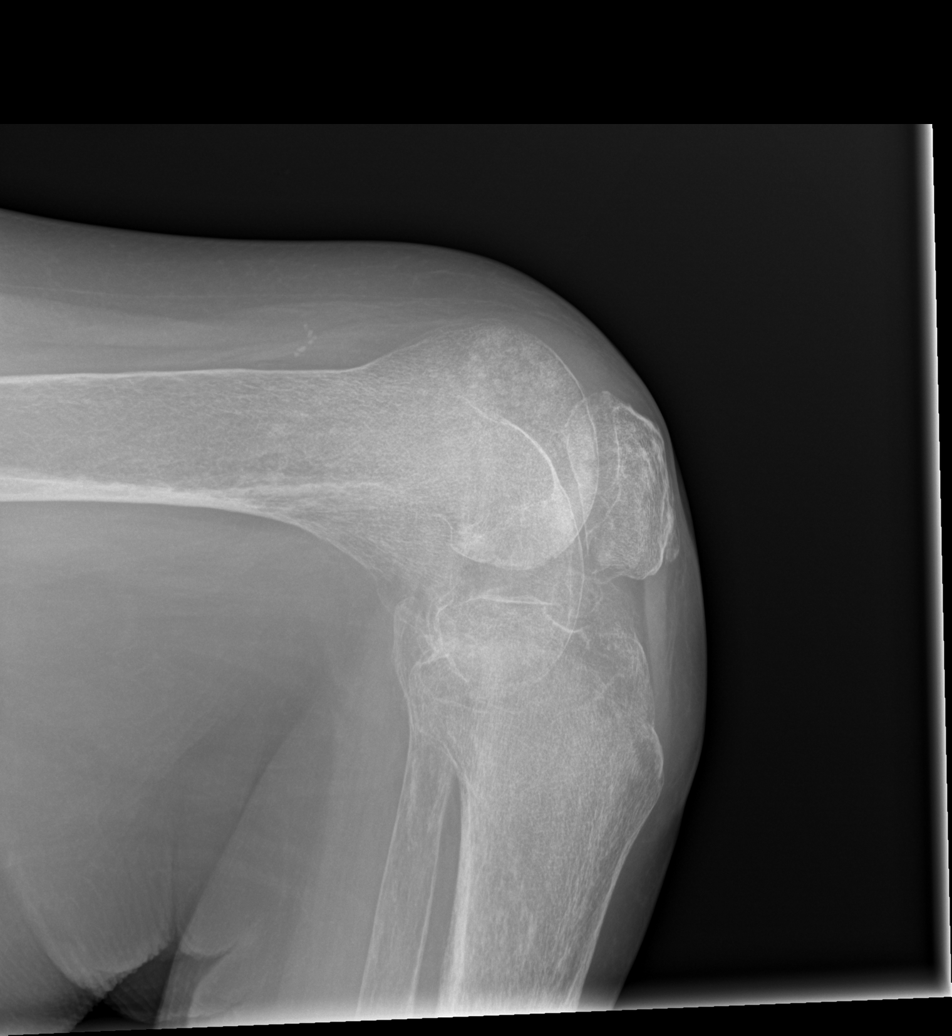

[w hip lat right (1 of 2)]
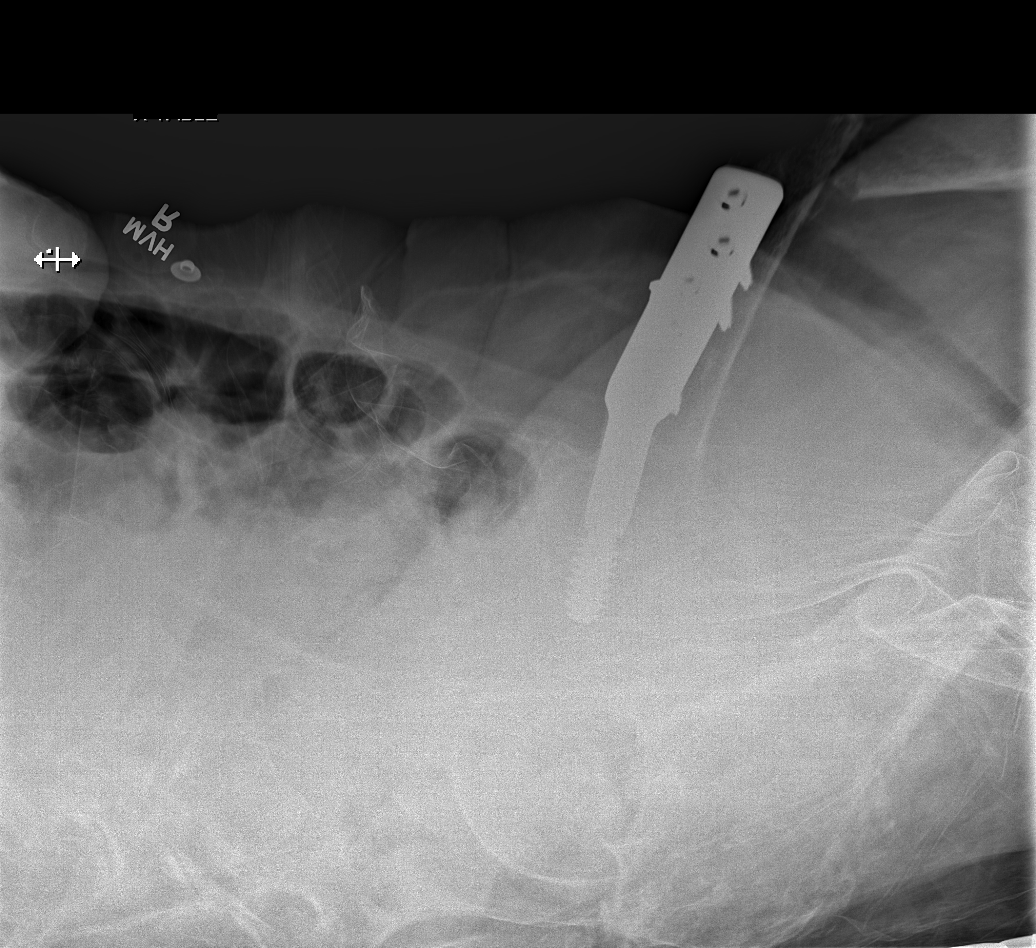

[w hip lat right (2 of 2)]
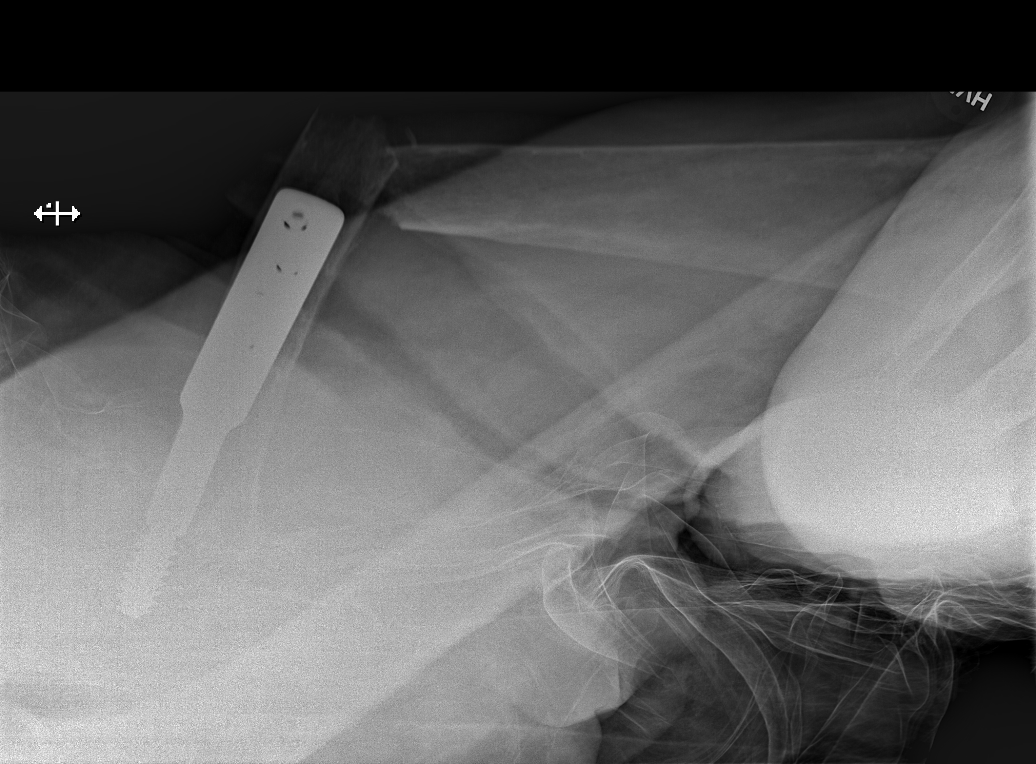

[5 of 5 positions shown; findings below may reference images not displayed]

FINDINGS: Sequelae of prior ORIF seen at the right hip. There is an acute
fracture through the right femoral neck with superior subluxation of
the right femoral shaft. Additional acute oblique fracture through
the proximal shaft of the right femur is present. The right femoral
head remains within the right acetabulum.

Diffuse osteopenia noted.

No soft tissue abnormality.
IMPRESSION: 1. Acute oblique fracture through the proximal right femoral shaft.
2. Additional acute fracture through the right femoral neck with
superior subluxation.
3. Sequelae of prior ORIF at the right hip. The proximal fracture
through the femoral neck traverses the fixation hardware. The
hardware itself is intact.
4. Diffuse osteopenia.

## 2015-12-08 IMAGING — CR DG KNEE COMPLETE 4+V*R*
2 series · 2 of 2 positions shown · non-contrast
Comparison: Prior radiograph from 03/27/2008.

CLINICAL DATA: Trauma

EXAM:
RIGHT KNEE - COMPLETE 4+ VIEW

[t knee lat right]
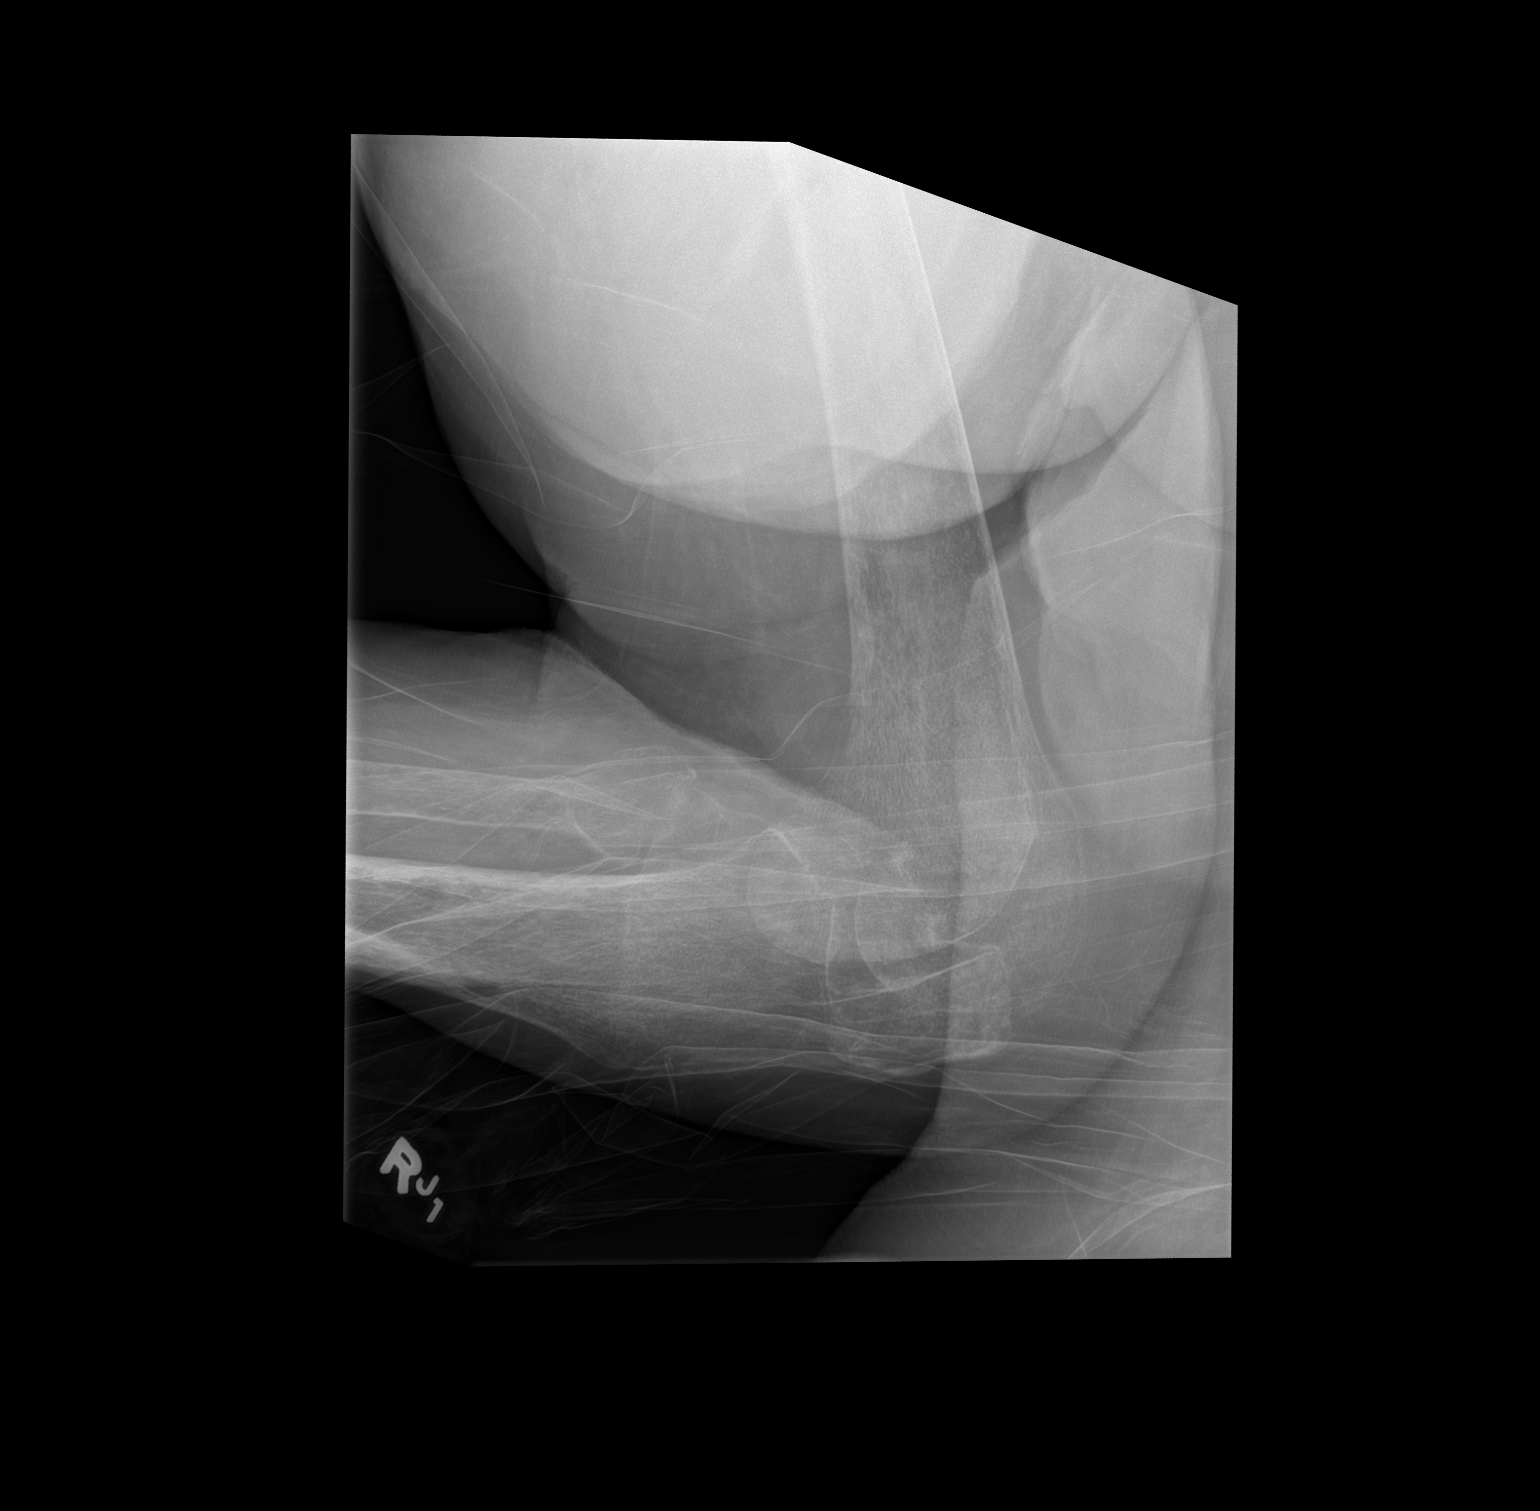

[x knee lat right]
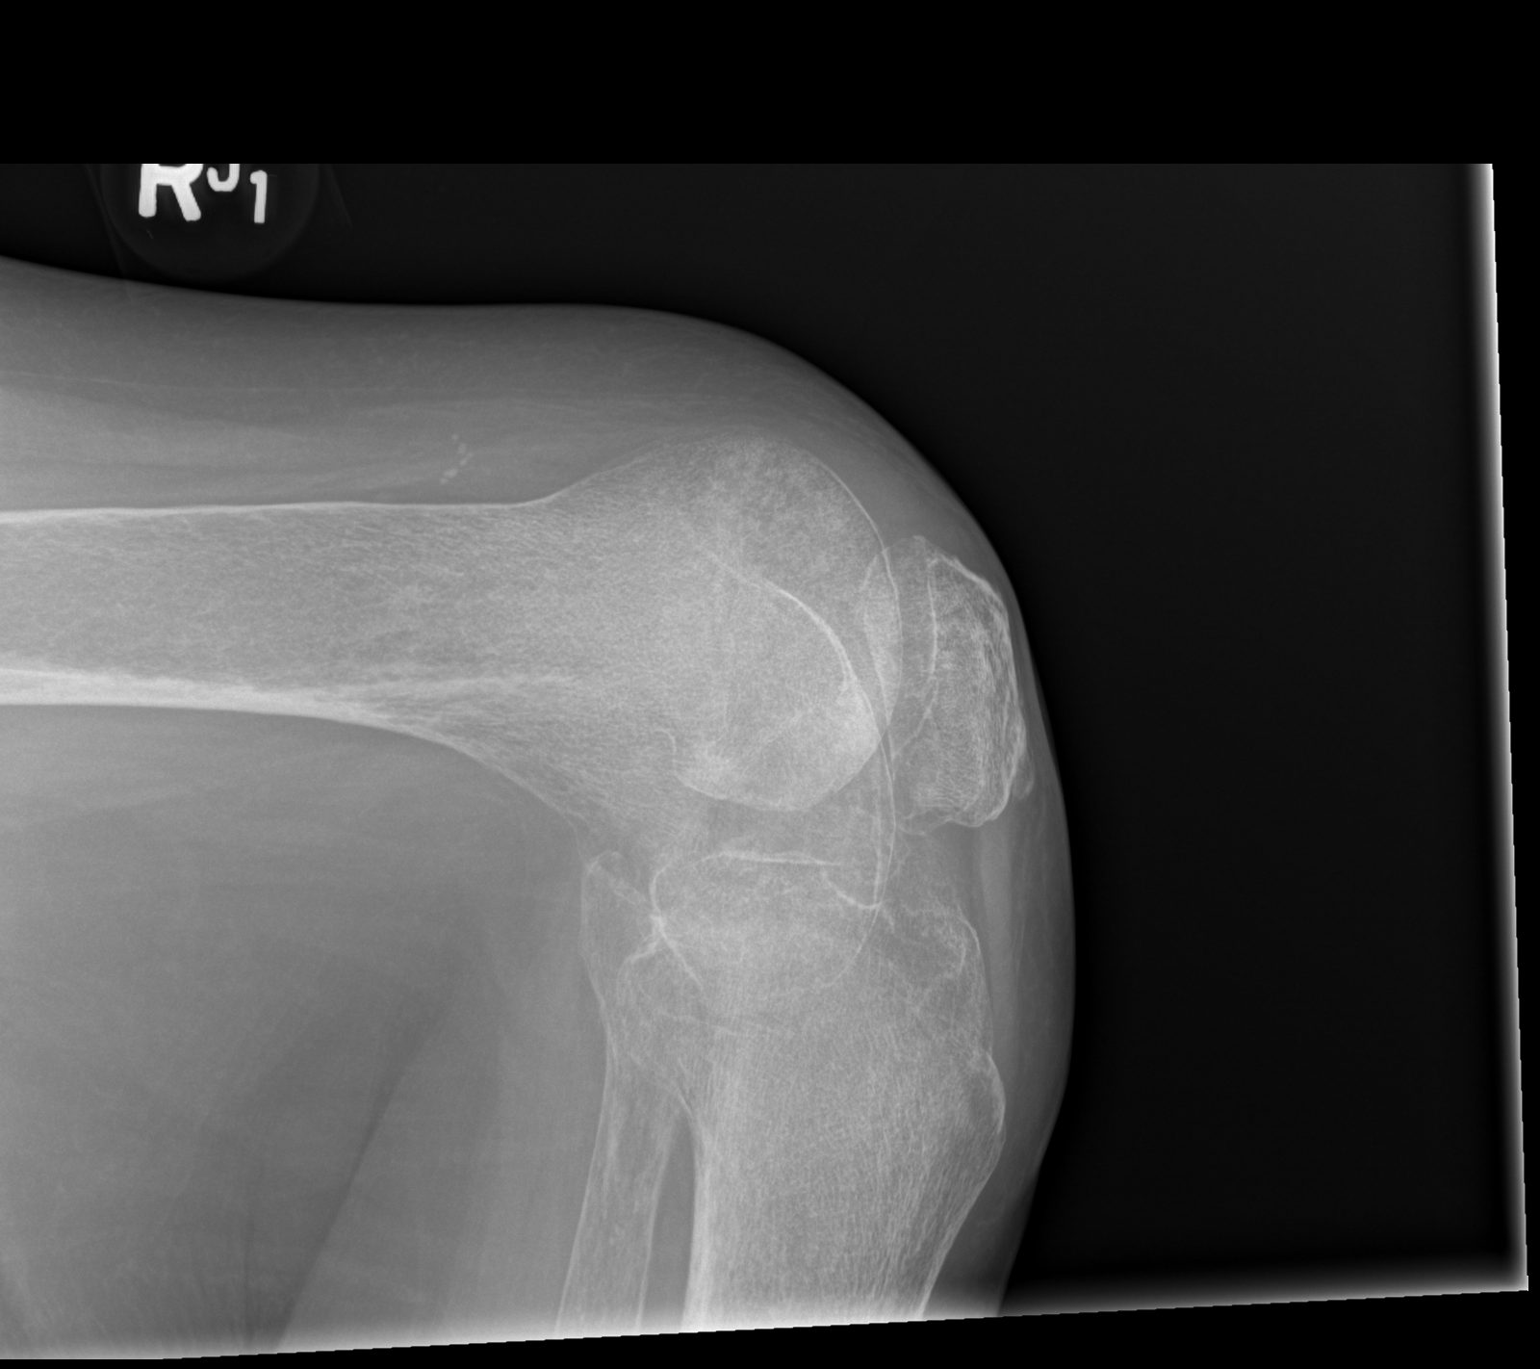

[2 of 2 positions shown; findings below may reference images not displayed]

FINDINGS: No acute fracture or dislocation. No joint effusion. The knee is in
anatomic alignment.

Severe osteopenia is present.
IMPRESSION: 1. No acute fracture or dislocation.
2. Severe osteopenia.

## 2016-10-04 DEATH — deceased
